# Patient Record
Sex: Female | Born: 1975 | Race: Black or African American | Hispanic: No | Smoking: Never smoker
Health system: Southern US, Community
[De-identification: ages and names within clinical notes are randomized; demographics above are authoritative.]

## PROBLEM LIST (undated history)

## (undated) DIAGNOSIS — N200 Calculus of kidney: Secondary | ICD-10-CM

## (undated) DIAGNOSIS — I10 Essential (primary) hypertension: Secondary | ICD-10-CM

## (undated) DIAGNOSIS — Z8711 Personal history of peptic ulcer disease: Secondary | ICD-10-CM

## (undated) DIAGNOSIS — F419 Anxiety disorder, unspecified: Secondary | ICD-10-CM

## (undated) DIAGNOSIS — R7303 Prediabetes: Secondary | ICD-10-CM

## (undated) DIAGNOSIS — Z8719 Personal history of other diseases of the digestive system: Secondary | ICD-10-CM

## (undated) DIAGNOSIS — G5600 Carpal tunnel syndrome, unspecified upper limb: Secondary | ICD-10-CM

## (undated) DIAGNOSIS — D649 Anemia, unspecified: Secondary | ICD-10-CM

## (undated) HISTORY — PX: CARPAL TUNNEL RELEASE: SHX101

## (undated) HISTORY — DX: Anxiety disorder, unspecified: F41.9

## (undated) HISTORY — PX: TUBAL LIGATION: SHX77

---

## 2000-10-10 ENCOUNTER — Other Ambulatory Visit: Admission: RE | Admit: 2000-10-10 | Discharge: 2000-10-10 | Payer: Self-pay | Admitting: Family Medicine

## 2000-12-24 ENCOUNTER — Encounter: Admission: RE | Admit: 2000-12-24 | Discharge: 2000-12-24 | Payer: Self-pay | Admitting: Family Medicine

## 2000-12-24 ENCOUNTER — Encounter: Payer: Self-pay | Admitting: Family Medicine

## 2002-03-05 ENCOUNTER — Other Ambulatory Visit: Admission: RE | Admit: 2002-03-05 | Discharge: 2002-03-05 | Payer: Self-pay | Admitting: Family Medicine

## 2003-03-30 ENCOUNTER — Other Ambulatory Visit: Admission: RE | Admit: 2003-03-30 | Discharge: 2003-03-30 | Payer: Self-pay | Admitting: Family Medicine

## 2003-07-05 ENCOUNTER — Encounter: Payer: Self-pay | Admitting: Internal Medicine

## 2003-07-05 ENCOUNTER — Ambulatory Visit (HOSPITAL_COMMUNITY): Admission: RE | Admit: 2003-07-05 | Discharge: 2003-07-05 | Payer: Self-pay | Admitting: Internal Medicine

## 2006-05-07 ENCOUNTER — Ambulatory Visit (HOSPITAL_COMMUNITY): Admission: RE | Admit: 2006-05-07 | Discharge: 2006-05-07 | Payer: Self-pay | Admitting: Family Medicine

## 2008-12-13 ENCOUNTER — Emergency Department (HOSPITAL_COMMUNITY): Admission: EM | Admit: 2008-12-13 | Discharge: 2008-12-14 | Payer: Self-pay | Admitting: Emergency Medicine

## 2010-12-21 ENCOUNTER — Emergency Department (HOSPITAL_COMMUNITY)
Admission: EM | Admit: 2010-12-21 | Discharge: 2010-12-21 | Disposition: A | Discharge status: Home / Self Care | Payer: PRIVATE HEALTH INSURANCE | Attending: Emergency Medicine | Admitting: Emergency Medicine

## 2010-12-21 DIAGNOSIS — I10 Essential (primary) hypertension: Secondary | ICD-10-CM | POA: Insufficient documentation

## 2010-12-21 DIAGNOSIS — M7989 Other specified soft tissue disorders: Secondary | ICD-10-CM | POA: Insufficient documentation

## 2010-12-21 DIAGNOSIS — R221 Localized swelling, mass and lump, neck: Secondary | ICD-10-CM | POA: Insufficient documentation

## 2010-12-21 DIAGNOSIS — M79609 Pain in unspecified limb: Secondary | ICD-10-CM | POA: Insufficient documentation

## 2010-12-21 DIAGNOSIS — M542 Cervicalgia: Secondary | ICD-10-CM | POA: Insufficient documentation

## 2010-12-21 DIAGNOSIS — R220 Localized swelling, mass and lump, head: Secondary | ICD-10-CM | POA: Insufficient documentation

## 2011-01-31 ENCOUNTER — Inpatient Hospital Stay (INDEPENDENT_AMBULATORY_CARE_PROVIDER_SITE_OTHER)
Admission: RE | Admit: 2011-01-31 | Discharge: 2011-01-31 | Disposition: A | Discharge status: Home / Self Care | Payer: PRIVATE HEALTH INSURANCE | Source: Ambulatory Visit | Attending: Family Medicine | Admitting: Family Medicine

## 2011-01-31 DIAGNOSIS — M779 Enthesopathy, unspecified: Secondary | ICD-10-CM

## 2011-05-08 ENCOUNTER — Emergency Department (HOSPITAL_COMMUNITY)
Admission: EM | Admit: 2011-05-08 | Discharge: 2011-05-08 | Disposition: A | Discharge status: Home / Self Care | Payer: PRIVATE HEALTH INSURANCE | Attending: Emergency Medicine | Admitting: Emergency Medicine

## 2011-05-08 ENCOUNTER — Emergency Department (HOSPITAL_COMMUNITY): Payer: PRIVATE HEALTH INSURANCE

## 2011-05-08 DIAGNOSIS — R109 Unspecified abdominal pain: Secondary | ICD-10-CM | POA: Insufficient documentation

## 2011-05-08 DIAGNOSIS — M5430 Sciatica, unspecified side: Secondary | ICD-10-CM | POA: Insufficient documentation

## 2011-05-08 DIAGNOSIS — X58XXXA Exposure to other specified factors, initial encounter: Secondary | ICD-10-CM | POA: Insufficient documentation

## 2011-05-08 DIAGNOSIS — S335XXA Sprain of ligaments of lumbar spine, initial encounter: Secondary | ICD-10-CM | POA: Insufficient documentation

## 2011-05-08 DIAGNOSIS — M545 Low back pain, unspecified: Secondary | ICD-10-CM | POA: Insufficient documentation

## 2011-05-08 DIAGNOSIS — N200 Calculus of kidney: Secondary | ICD-10-CM | POA: Insufficient documentation

## 2011-05-08 DIAGNOSIS — M79609 Pain in unspecified limb: Secondary | ICD-10-CM | POA: Insufficient documentation

## 2011-05-08 DIAGNOSIS — I10 Essential (primary) hypertension: Secondary | ICD-10-CM | POA: Insufficient documentation

## 2011-05-08 DIAGNOSIS — E669 Obesity, unspecified: Secondary | ICD-10-CM | POA: Insufficient documentation

## 2011-05-08 LAB — URINALYSIS, ROUTINE W REFLEX MICROSCOPIC
Ketones, ur: NEGATIVE mg/dL
Leukocytes, UA: NEGATIVE
Nitrite: NEGATIVE
Protein, ur: NEGATIVE mg/dL
Urobilinogen, UA: 0.2 mg/dL (ref 0.0–1.0)

## 2011-05-08 LAB — URINE MICROSCOPIC-ADD ON

## 2011-05-08 MED ORDER — IOHEXOL 300 MG/ML  SOLN
100.0000 mL | Freq: Once | INTRAMUSCULAR | Status: AC | PRN
  Administered 2011-05-08: 100 mL via INTRAVENOUS

## 2011-07-12 ENCOUNTER — Emergency Department (HOSPITAL_COMMUNITY)
Admission: EM | Admit: 2011-07-12 | Discharge: 2011-07-12 | Disposition: A | Discharge status: Home / Self Care | Payer: PRIVATE HEALTH INSURANCE | Attending: Emergency Medicine | Admitting: Emergency Medicine

## 2011-07-12 DIAGNOSIS — I10 Essential (primary) hypertension: Secondary | ICD-10-CM | POA: Insufficient documentation

## 2011-07-12 DIAGNOSIS — R220 Localized swelling, mass and lump, head: Secondary | ICD-10-CM | POA: Insufficient documentation

## 2011-07-12 DIAGNOSIS — K029 Dental caries, unspecified: Secondary | ICD-10-CM | POA: Insufficient documentation

## 2011-07-12 DIAGNOSIS — K089 Disorder of teeth and supporting structures, unspecified: Secondary | ICD-10-CM | POA: Insufficient documentation

## 2011-07-12 DIAGNOSIS — Z79899 Other long term (current) drug therapy: Secondary | ICD-10-CM | POA: Insufficient documentation

## 2011-07-12 DIAGNOSIS — R221 Localized swelling, mass and lump, neck: Secondary | ICD-10-CM | POA: Insufficient documentation

## 2011-09-29 ENCOUNTER — Emergency Department (HOSPITAL_COMMUNITY)
Admission: EM | Admit: 2011-09-29 | Discharge: 2011-09-30 | Disposition: A | Discharge status: Home / Self Care | Payer: PRIVATE HEALTH INSURANCE | Attending: Emergency Medicine | Admitting: Emergency Medicine

## 2011-09-29 ENCOUNTER — Encounter (HOSPITAL_COMMUNITY): Payer: Self-pay | Admitting: *Deleted

## 2011-09-29 DIAGNOSIS — M79609 Pain in unspecified limb: Secondary | ICD-10-CM | POA: Insufficient documentation

## 2011-09-29 DIAGNOSIS — M79606 Pain in leg, unspecified: Secondary | ICD-10-CM

## 2011-09-29 DIAGNOSIS — Z79899 Other long term (current) drug therapy: Secondary | ICD-10-CM | POA: Insufficient documentation

## 2011-09-29 DIAGNOSIS — M25579 Pain in unspecified ankle and joints of unspecified foot: Secondary | ICD-10-CM | POA: Insufficient documentation

## 2011-09-29 HISTORY — DX: Essential (primary) hypertension: I10

## 2011-09-29 NOTE — ED Notes (Signed)
Pt c/o lt lower leg for 2 weeks intermittently .  No known injury.  She has a cramping sensation and now has some numbness in her lt foot

## 2011-09-29 NOTE — ED Notes (Signed)
Pt to tx area via ambulatory with c/o left leg pain x one to two months. Smiles and converses with ease. Aware of plan of care. S/O at bedside. Pt noted in no acute distress.

## 2011-09-30 ENCOUNTER — Ambulatory Visit (HOSPITAL_COMMUNITY)
Admission: EM | Admit: 2011-09-30 | Payer: PRIVATE HEALTH INSURANCE | Source: Ambulatory Visit | Admitting: Emergency Medicine

## 2011-09-30 DIAGNOSIS — M79609 Pain in unspecified limb: Secondary | ICD-10-CM

## 2011-09-30 MED ORDER — HYDROCODONE-ACETAMINOPHEN 5-500 MG PO TABS: 1.0000 | ORAL_TABLET | Freq: Four times a day (QID) | ORAL | Status: AC | PRN

## 2011-09-30 MED ORDER — PREDNISONE 10 MG PO TABS: 20.0000 mg | ORAL_TABLET | Freq: Two times a day (BID) | ORAL | Status: DC

## 2011-09-30 NOTE — ED Provider Notes (Signed)
History     CSN: [id number]  Arrival date & time 09/29/11  2229   First MD Initiated Contact with Patient 09/30/11 0005      Chief Complaint  Patient presents with  . Leg Pain    (Consider location/radiation/quality/duration/timing/severity/associated sxs/prior treatment) HPI Comments: Presents complaining of a two month history of left leg pain.  She works as a Lawyer at [employer] but does not recall any specific injury.  Worse with ambulation.  Pain is in the calf and into ankle and extends into the back of the thigh.  She denies back pain or bowel or bladder complaints.  No fevers or chills.  No chest pain or shortness of breath.  Patient is a 36 y.o. female presenting with leg pain. The history is provided by the patient.  Leg Pain  The incident occurred at work. There was no injury mechanism. The pain is present in the left leg. The pain is moderate. Pertinent negatives include no numbness, no loss of motion and no muscle weakness.    Past Medical History  Diagnosis Date  . Hypertension     History reviewed. No pertinent past surgical history.  History reviewed. No pertinent family history.  History  Substance Use Topics  . Smoking status: Never Smoker   . Smokeless tobacco: Not on file  . Alcohol Use: Yes    OB History    Grav Para Term Preterm Abortions TAB SAB Ect Mult Living                  Review of Systems  Neurological: Negative for numbness.  All other systems reviewed and are negative.    Allergies  Aspirin and Ibuprofen  Home Medications   Current Outpatient Rx  Name Route Sig Dispense Refill  . ACETAMINOPHEN 500 MG PO TABS Oral Take 1,000 mg by mouth every 4 (four) hours as needed. For pain.    Marland Kitchen CLONAZEPAM 0.5 MG PO TABS Oral Take 0.25 mg by mouth 2 (two) times daily as needed. For anxiety    . HYDROCHLOROTHIAZIDE 12.5 MG PO CAPS Oral Take 12.5 mg by mouth daily.    Marland Kitchen OVER THE COUNTER MEDICATION Oral Take 2 capsules by mouth every 4  (four) hours as needed. OTC Leg Cramps. Uses when she feels onset of leg cramps coming on.      BP 156/89  Temp(Src) 98.3 F (36.8 C) (Oral)  Resp 20  SpO2 100%  LMP 09/07/2011  Physical Exam  Nursing note and vitals reviewed. Constitutional: She appears well-developed and well-nourished.  HENT:  Head: Normocephalic.  Neck: Normal range of motion. Neck supple.  Cardiovascular: Normal rate and regular rhythm.   No murmur heard. Pulmonary/Chest: Effort normal and breath sounds normal. No respiratory distress.  Musculoskeletal: Normal range of motion. She exhibits no edema.       The left leg appears grossly normal and is symmetrical with the right.  There is no edema.  There is some calf tenderness but no palpable cord and absent Homan sign. Pulses are easily palpable distally.  Neurological: She is alert.  Skin: Skin is warm and dry.    ED Course  Procedures (including critical care time)  Labs Reviewed - No data to display No results found.   No diagnosis found.    MDM  Suspect sciatica but would like to rule out dvt with vascular study as outpt.  Will prescribe prednisone, lortab and arrange vascular study.  Geoffery Lyons, MD 09/30/11 [phone]

## 2011-09-30 NOTE — ED Notes (Signed)
Ambulatory to check out desk stable and in no acute distress at time of discharge.  

## 2012-01-05 ENCOUNTER — Emergency Department (HOSPITAL_COMMUNITY)
Admission: EM | Admit: 2012-01-05 | Discharge: 2012-01-05 | Disposition: A | Discharge status: Home / Self Care | Payer: PRIVATE HEALTH INSURANCE | Attending: Emergency Medicine | Admitting: Emergency Medicine

## 2012-01-05 ENCOUNTER — Encounter (HOSPITAL_COMMUNITY): Payer: Self-pay | Admitting: Emergency Medicine

## 2012-01-05 DIAGNOSIS — I10 Essential (primary) hypertension: Secondary | ICD-10-CM | POA: Insufficient documentation

## 2012-01-05 DIAGNOSIS — R05 Cough: Secondary | ICD-10-CM | POA: Insufficient documentation

## 2012-01-05 DIAGNOSIS — J069 Acute upper respiratory infection, unspecified: Secondary | ICD-10-CM

## 2012-01-05 DIAGNOSIS — R059 Cough, unspecified: Secondary | ICD-10-CM | POA: Insufficient documentation

## 2012-01-05 DIAGNOSIS — J040 Acute laryngitis: Secondary | ICD-10-CM

## 2012-01-05 DIAGNOSIS — H9209 Otalgia, unspecified ear: Secondary | ICD-10-CM | POA: Insufficient documentation

## 2012-01-05 DIAGNOSIS — R509 Fever, unspecified: Secondary | ICD-10-CM | POA: Insufficient documentation

## 2012-01-05 DIAGNOSIS — Z79899 Other long term (current) drug therapy: Secondary | ICD-10-CM | POA: Insufficient documentation

## 2012-01-05 HISTORY — DX: Carpal tunnel syndrome, unspecified upper limb: G56.00

## 2012-01-05 NOTE — ED Notes (Signed)
Pt c/o sore throat, ear pain, and cough since Wednesday. Pt has taken over the counter medications without relief. Pt talks with hoarse voice.

## 2012-01-05 NOTE — ED Provider Notes (Signed)
History     CSN: [id number]  Arrival date & time 01/05/12  4098   First MD Initiated Contact with Patient 01/05/12 0915      Chief Complaint  Patient presents with  . Sore Throat  . Otalgia  . Cough    (Consider location/radiation/quality/duration/timing/severity/associated sxs/prior treatment) HPI  35yoF pw multple complaints. C/O 3 days of NP cough, b/l ear pain, sore throat. Low grade fever to 100.1 at home. Has been taking tylenol with min relief. Denies sick contacts. Denies rash. No chest pain or shortness of breath. C/O hoarse voice as well. No noisy breathing. No sensation of throat fullness/tightening.  ED Notes, ED Provider Notes from 01/05/12 0000 to 01/05/12 09:02:13       Keshia Tommy Rainwater, RN 01/05/2012 09:00      Pt c/o sore throat, ear pain, and cough since Wednesday. Pt has taken over the counter medications without relief. Pt talks with hoarse voice.     Past Medical History  Diagnosis Date  . Hypertension   . Carpal tunnel syndrome     Past Surgical History  Procedure Date  . Tubal ligation   . Carpal tunnel release     History reviewed. No pertinent family history.  History  Substance Use Topics  . Smoking status: Never Smoker   . Smokeless tobacco: Not on file  . Alcohol Use: No    OB History    Grav Para Term Preterm Abortions TAB SAB Ect Mult Living                  Review of Systems  All other systems reviewed and are negative.   except as noted HPI   Allergies  Aspirin and Ibuprofen  Home Medications   Current Outpatient Rx  Name Route Sig Dispense Refill  . ACETAMINOPHEN 500 MG PO TABS Oral Take 1,000 mg by mouth every 4 (four) hours as needed. For pain.    Marland Kitchen CLONAZEPAM 0.5 MG PO TABS Oral Take 0.25 mg by mouth 2 (two) times daily as needed. For anxiety    . HYDROCHLOROTHIAZIDE 12.5 MG PO CAPS Oral Take 12.5 mg by mouth daily.    Marland Kitchen OVER THE COUNTER MEDICATION Oral Take 2 capsules by mouth every 4 (four) hours as needed.  OTC Leg Cramps. Uses when she feels onset of leg cramps coming on.    Marland Kitchen PHENOL 1.4 % MT LIQD Mouth/Throat Use as directed 1 spray in the mouth or throat daily as needed. For sore throat.    Marland Kitchen PSEUDOEPH-CPM-DM-APAP 30-2-15-325 MG PO TABS Oral Take 30 mLs by mouth once.    . DAYQUIL PO Oral Take 2 tablets by mouth once.      BP 132/89  Pulse 88  Temp(Src) 98.8 F (37.1 C) (Oral)  Resp 20  SpO2 100%  LMP 12/12/2011  Physical Exam  Nursing note and vitals reviewed. Constitutional: She is oriented to person, place, and time. She appears well-developed.  HENT:  Head: Atraumatic.  Mouth/Throat: Oropharynx is clear and moist.  Eyes: Conjunctivae and EOM are normal. Pupils are equal, round, and reactive to light.  Neck: Normal range of motion. Neck supple.  Cardiovascular: Normal rate, regular rhythm, normal heart sounds and intact distal pulses.   Pulmonary/Chest: Effort normal and breath sounds normal. No respiratory distress. She has no wheezes. She has no rales.  Abdominal: Soft. She exhibits no distension. There is no tenderness. There is no rebound and no guarding.  Musculoskeletal: Normal range of motion.  Neurological: She  is alert and oriented to person, place, and time.  Skin: Skin is warm and dry. No rash noted.  Psychiatric: She has a normal mood and affect.    ED Course  Procedures (including critical care time)   Labs Reviewed  RAPID STREP SCREEN  STREP A DNA PROBE   No results found.   1. Upper respiratory infection   2. Laryngitis     MDM  Likely URI/pharyngitis/laryngitis. Supportive care, pmd f/u. Precautions for return.        Forbes Cellar, MD 01/05/12 1037

## 2012-01-05 NOTE — Discharge Instructions (Signed)
Antibiotic Nonuse  Your caregiver felt that the infection or problem was not one that would be helped with an antibiotic. Infections may be caused by viruses or bacteria. Only a caregiver can tell which one of these is the likely cause of an illness. A cold is the most common cause of infection in both adults and children. A cold is a virus. Antibiotic treatment will have no effect on a viral infection. Viruses can lead to many lost days of work caring for sick children and many missed days of school. Children may catch as many as 10 "colds" or "flus" per year during which they can be tearful, cranky, and uncomfortable. The goal of treating a virus is aimed at keeping the ill person comfortable. Antibiotics are medications used to help the body fight bacterial infections. There are relatively few types of bacteria that cause infections but there are hundreds of viruses. While both viruses and bacteria cause infection they are very different types of germs. A viral infection will typically go away by itself within 7 to 10 days. Bacterial infections may spread or get worse without antibiotic treatment. Examples of bacterial infections are:  Sore throats (like strep throat or tonsillitis).   Infection in the lung (pneumonia).   Ear and skin infections.  Examples of viral infections are:  Colds or flus.   Most coughs and bronchitis.   Sore throats not caused by Strep.   Runny noses.  It is often best not to take an antibiotic when a viral infection is the cause of the problem. Antibiotics can kill off the helpful bacteria that we have inside our body and allow harmful bacteria to start growing. Antibiotics can cause side effects such as allergies, nausea, and diarrhea without helping to improve the symptoms of the viral infection. Additionally, repeated uses of antibiotics can cause bacteria inside of our body to become resistant. That resistance can be passed onto harmful bacterial. The next time  you have an infection it may be harder to treat if antibiotics are used when they are not needed. Not treating with antibiotics allows our own immune system to develop and take care of infections more efficiently. Also, antibiotics will work better for [place] when they are prescribed for bacterial infections. Treatments for a child that is ill may include:  Give extra fluids throughout the day to stay hydrated.   Get plenty of rest.   Only give your child over-the-counter or prescription medicines for pain, discomfort, or fever as directed by your caregiver.   The use of a cool mist humidifier may help stuffy noses.   Cold medications if suggested by your caregiver.  Your caregiver may decide to start you on an antibiotic if:  The problem you were seen for today continues for a longer length of time than expected.   You develop a secondary bacterial infection.  SEEK MEDICAL CARE IF:  Fever lasts longer than 5 days.   Symptoms continue to get worse after 5 to 7 days or become severe.   Difficulty in breathing develops.   Signs of dehydration develop (poor drinking, rare urinating, dark colored urine).   Changes in behavior or worsening tiredness (listlessness or lethargy).  Document Released: 11/12/2001 Document Revised: 08/23/2011 Document Reviewed: 05/11/2009 [facility] Patient Information 2012 [place], [place].  Laryngitis At the top of your windpipe is your voice box. It is the source of your voice. Inside your voice box are 2 bands of muscles called vocal cords. When you breathe, your vocal cords  are relaxed and open so that air can get into the lungs. When you decide to say something, these cords come together and vibrate. The sound from these vibrations goes into your throat and comes out through your mouth as sound. Laryngitis is an inflammation of the vocal cords that causes hoarseness, cough, loss of voice, sore throat, and dry throat. Laryngitis can be temporary (acute) or  long-term (chronic). Most cases of acute laryngitis improve with time.Chronic laryngitis lasts for more than 3 weeks. CAUSES Laryngitis can often be related to excessive smoking, talking, or yelling, as well as inhalation of toxic fumes and allergies. Acute laryngitis is usually caused by a viral infection, vocal strain, measles or mumps, or bacterial infections. Chronic laryngitis is usually caused by vocal cord strain, vocal cord injury, postnasal drip, growths on the vocal cords, or acid reflux. SYMPTOMS   Cough.   Sore throat.   Dry throat.  RISK FACTORS  Respiratory infections.   Exposure to irritating substances, such as cigarette smoke, excessive amounts of alcohol, stomach acids, and workplace chemicals.   Voice trauma, such as vocal cord injury from shouting or speaking too loud.  DIAGNOSIS  Your cargiver will perform a physical exam. During the physical exam, your caregiver will examine your throat. The most common sign of laryngitis is hoarseness.  HOME CARE INSTRUCTIONS  Drink enough fluids to keep your urine clear or pale yellow.   Rest until you no longer have symptoms or as directed by your caregiver.   Breathe in moist air.   Take all medicine as directed by your caregiver.   Do not smoke.   Talk as little as possible (this includes whispering).   Write on paper instead of talking until your voice is back to normal.   Follow up with your caregiver if your condition has not improved after 10 days.  SEEK MEDICAL CARE IF:   You have trouble breathing.   You cough up blood.   You have persistent fever.   You have increasing pain.   You have difficulty swallowing.  MAKE SURE YOU:  Understand these instructions.   Will watch your condition.   Will get help right away if you are not doing well or get worse.  Document Released: 09/03/2005 Document Revised: 08/23/2011 Document Reviewed: 11/09/2010 [facility] Patient Information 2012 [place],  [place].  RESOURCE GUIDE  Dental Problems  Patients with Medicaid: [facility] [phone] [address]                                           [phone] [address] Phone:  [phone]                                                   Phone:  [phone]  If unable to pay or uninsured, contact:  [facility] or [facility]. to become qualified for the adult dental clinic.  Chronic Pain Problems Contact [facility]  [phone] Patients need to be referred by their primary care doctor.  Insufficient Money for Medicine Contact [employer]:  call "211" or [facility] [phone].  No Primary Care Doctor Call [facility]  [phone] Other agencies that provide inexpensive medical care    [facility]  [phone]    [facility]  [phone]    [facility]  [phone]    [facility]  [phone]    [facility]  [phone]    [facility]  [phone]  Psychological Services [facility]  [phone] [facility]  [phone] [facility]   [phone] (emergency services [phone])  Abuse/Neglect [facility] Child Abuse Hotline 726-760-3786 [facility] Child Abuse Hotline [phone] (After Hours)  Emergency Shelter [facility] [phone]  Maternity Homes [facility] at [facility] [phone] [facility] [phone]  MRSA Hotline #:   332 173 7569    [facility] Resources  [facility]  [employer]                           [facility] [address] [place]                     [address]         [address] [place] [address]  [place]ell Reveal Phone:  [phone]                                  Phone:  [phone]                   Phone:   [phone]  [facility] Phone:  [phone]  [facility] Child Abuse Hotline [phone] [phone] (After Hours)

## 2012-06-12 ENCOUNTER — Emergency Department (HOSPITAL_COMMUNITY): Payer: PRIVATE HEALTH INSURANCE

## 2012-06-12 ENCOUNTER — Encounter (HOSPITAL_COMMUNITY): Payer: Self-pay | Admitting: Emergency Medicine

## 2012-06-12 ENCOUNTER — Emergency Department (HOSPITAL_COMMUNITY)
Admission: EM | Admit: 2012-06-12 | Discharge: 2012-06-12 | Disposition: A | Discharge status: Home / Self Care | Payer: PRIVATE HEALTH INSURANCE | Attending: Emergency Medicine | Admitting: Emergency Medicine

## 2012-06-12 DIAGNOSIS — Z87442 Personal history of urinary calculi: Secondary | ICD-10-CM | POA: Insufficient documentation

## 2012-06-12 DIAGNOSIS — R109 Unspecified abdominal pain: Secondary | ICD-10-CM

## 2012-06-12 DIAGNOSIS — Z886 Allergy status to analgesic agent status: Secondary | ICD-10-CM | POA: Insufficient documentation

## 2012-06-12 DIAGNOSIS — Z79899 Other long term (current) drug therapy: Secondary | ICD-10-CM | POA: Insufficient documentation

## 2012-06-12 DIAGNOSIS — I10 Essential (primary) hypertension: Secondary | ICD-10-CM | POA: Insufficient documentation

## 2012-06-12 LAB — COMPREHENSIVE METABOLIC PANEL
ALT: 8 U/L (ref 0–35)
AST: 16 U/L (ref 0–37)
Albumin: 3.2 g/dL — ABNORMAL LOW (ref 3.5–5.2)
Alkaline Phosphatase: 75 U/L (ref 39–117)
CO2: 27 mEq/L (ref 19–32)
Chloride: 104 mEq/L (ref 96–112)
GFR calc non Af Amer: >90 mL/min (ref >90)
Potassium: 4 mEq/L (ref 3.5–5.1)
Total Bilirubin: 0.1 mg/dL — ABNORMAL LOW (ref 0.3–1.2)

## 2012-06-12 LAB — POCT PREGNANCY, URINE: Preg Test, Ur: NEGATIVE

## 2012-06-12 LAB — CBC WITH DIFFERENTIAL/PLATELET
Basophils Absolute: 0 10*3/uL (ref 0.0–0.1)
Basophils Relative: 0 % (ref 0–1)
HCT: 30.8 % — ABNORMAL LOW (ref 36.0–46.0)
Hemoglobin: 9.6 g/dL — ABNORMAL LOW (ref 12.0–15.0)
Lymphocytes Relative: 31 % (ref 12–46)
MCHC: 31.2 g/dL (ref 30.0–36.0)
Monocytes Absolute: 0.6 10*3/uL (ref 0.1–1.0)
Neutro Abs: 4.4 10*3/uL (ref 1.7–7.7)
Neutrophils Relative %: 59 % (ref 43–77)
RDW: 18 % — ABNORMAL HIGH (ref 11.5–15.5)
WBC: 7.5 10*3/uL (ref 4.0–10.5)

## 2012-06-12 LAB — URINALYSIS, ROUTINE W REFLEX MICROSCOPIC
Glucose, UA: NEGATIVE mg/dL
Hgb urine dipstick: NEGATIVE
Leukocytes, UA: NEGATIVE
Protein, ur: NEGATIVE mg/dL
Specific Gravity, Urine: 1.01 (ref 1.005–1.030)
Urobilinogen, UA: 0.2 mg/dL (ref 0.0–1.0)

## 2012-06-12 MED ORDER — CYCLOBENZAPRINE HCL 10 MG PO TABS: 10.0000 mg | ORAL_TABLET | Freq: Two times a day (BID) | ORAL | Status: DC | PRN

## 2012-06-12 MED ORDER — ONDANSETRON HCL 4 MG/2ML IJ SOLN
4.0000 mg | Freq: Once | INTRAMUSCULAR | Status: AC
  Administered 2012-06-12: 4 mg via INTRAVENOUS
  Filled 2012-06-12: qty 2

## 2012-06-12 MED ORDER — HYDROCODONE-ACETAMINOPHEN 5-500 MG PO TABS: 1.0000 | ORAL_TABLET | Freq: Four times a day (QID) | ORAL | Status: DC | PRN

## 2012-06-12 MED ORDER — MORPHINE SULFATE 4 MG/ML IJ SOLN
4.0000 mg | Freq: Once | INTRAMUSCULAR | Status: AC
  Administered 2012-06-12: 4 mg via INTRAVENOUS
  Filled 2012-06-12: qty 1

## 2012-06-12 NOTE — ED Notes (Signed)
Pt c/o right sided flank pain into back x 2 weeks; pt sts some nausea; pt sts hx of kidney stone and sts feels same

## 2012-06-12 NOTE — ED Provider Notes (Signed)
History     CSN: [id number]  Arrival date & time 06/12/12  1206   First MD Initiated Contact with Patient 06/12/12 1328      Chief Complaint  Patient presents with  . Flank Pain    (Consider location/radiation/quality/duration/timing/severity/associated sxs/prior treatment) Patient is a 36 y.o. female presenting with flank pain.  Flank Pain This is a new problem. The current episode started 1 to 4 weeks ago. The problem has been gradually worsening. Associated symptoms include abdominal pain and nausea. Pertinent negatives include no anorexia, chills, fever, headaches, neck pain, urinary symptoms, vomiting or weakness. She has tried acetaminophen for the symptoms. The treatment provided no relief.  Pt reports right flank pain for about 2 wks. States radiates into lower abdomen. Hx of kidney stones. States pain comes and goes. Denies any urinary symptoms, no fever, no blood in her urine. Admits to nausea. Denies vomiting. Symptoms worsened with laying back flat and eating.   Past Medical History  Diagnosis Date  . Hypertension   . Carpal tunnel syndrome     Past Surgical History  Procedure Date  . Tubal ligation   . Carpal tunnel release     History reviewed. No pertinent family history.  History  Substance Use Topics  . Smoking status: Never Smoker   . Smokeless tobacco: Not on file  . Alcohol Use: No    OB History    Grav Para Term Preterm Abortions TAB SAB Ect Mult Living                  Review of Systems  Constitutional: Negative for fever and chills.  HENT: Negative for neck pain and neck stiffness.   Respiratory: Negative.   Cardiovascular: Negative.   Gastrointestinal: Positive for nausea and abdominal pain. Negative for vomiting, diarrhea, constipation and anorexia.  Genitourinary: Positive for flank pain. Negative for dysuria, hematuria, decreased urine volume, vaginal bleeding, vaginal discharge, vaginal pain and menstrual problem.  Musculoskeletal:  Negative for back pain.  Skin: Negative.   Neurological: Negative for dizziness, weakness and headaches.  Psychiatric/Behavioral: Negative.     Allergies  Aspirin and Ibuprofen  Home Medications   Current Outpatient Rx  Name Route Sig Dispense Refill  . ACETAMINOPHEN 500 MG PO TABS Oral Take 1,000 mg by mouth every 4 (four) hours as needed. For pain.    Marland Kitchen CLONAZEPAM 0.5 MG PO TABS Oral Take 0.25 mg by mouth 2 (two) times daily as needed. For anxiety    . HYDROCHLOROTHIAZIDE 12.5 MG PO CAPS Oral Take 12.5 mg by mouth daily.    Marland Kitchen OVER THE COUNTER MEDICATION Oral Take 2 capsules by mouth every 4 (four) hours as needed. OTC Leg Cramps. Uses when she feels onset of leg cramps coming on.    Marland Kitchen PHENOL 1.4 % MT LIQD Mouth/Throat Use as directed 1 spray in the mouth or throat daily as needed. For sore throat.    Marland Kitchen PSEUDOEPH-CPM-DM-APAP 30-2-15-325 MG PO TABS Oral Take 30 mLs by mouth once.    . DAYQUIL PO Oral Take 2 tablets by mouth once.      BP 153/83  Pulse 94  Temp 98.6 F (37 C) (Oral)  Resp 16  SpO2 100%  Physical Exam  Nursing note and vitals reviewed. Constitutional: She is oriented to person, place, and time. She appears well-developed and well-nourished. No distress.  Eyes: Conjunctivae normal are normal.  Neck: Neck supple.  Cardiovascular: Normal rate, regular rhythm and normal heart sounds.   Pulmonary/Chest: Effort normal  and breath sounds normal. No respiratory distress. She has no wheezes. She has no rales.  Abdominal: Soft. Bowel sounds are normal. There is no rebound and no guarding.       RUQ tenderness. No CVA tenderness bilaterally  Neurological: She is alert and oriented to person, place, and time.  Skin: Skin is warm and dry.  Psychiatric: She has a normal mood and affect. Her behavior is normal.    ED Course  Procedures (including critical care time)  PT with right flank and right upper abdominal pain. UA is back and is clear with no RBCs and no signs of  infection. Will get [place] to look for cholecystitis or possible hydronephrosis. Pain meds and labs pending.   Results for orders placed during the hospital encounter of 06/12/12  URINALYSIS, ROUTINE W REFLEX MICROSCOPIC      Component Value Range   Color, Urine YELLOW  YELLOW   APPearance CLEAR  CLEAR   Specific Gravity, Urine 1.010  1.005 - 1.030   pH 6.5  5.0 - 8.0   Glucose, UA NEGATIVE  NEGATIVE mg/dL   Hgb urine dipstick NEGATIVE  NEGATIVE   Bilirubin Urine NEGATIVE  NEGATIVE   Ketones, ur NEGATIVE  NEGATIVE mg/dL   Protein, ur NEGATIVE  NEGATIVE mg/dL   Urobilinogen, UA 0.2  0.0 - 1.0 mg/dL   Nitrite NEGATIVE  NEGATIVE   Leukocytes, UA NEGATIVE  NEGATIVE  POCT PREGNANCY, URINE      Component Value Range   Preg Test, Ur NEGATIVE  NEGATIVE  CBC WITH DIFFERENTIAL      Component Value Range   WBC 7.5  4.0 - 10.5 K/uL   RBC 4.30  3.87 - 5.11 MIL/uL   Hemoglobin 9.6 (*) 12.0 - 15.0 g/dL   HCT 45.4 (*) 09.8 - 11.9 %   MCV 71.6 (*) 78.0 - 100.0 fL   MCH 22.3 (*) 26.0 - 34.0 pg   MCHC 31.2  30.0 - 36.0 g/dL   RDW 14.7 (*) 82.9 - 56.2 %   Platelets 365  150 - 400 K/uL   Neutrophils Relative 59  43 - 77 %   Neutro Abs 4.4  1.7 - 7.7 K/uL   Lymphocytes Relative 31  12 - 46 %   Lymphs Abs 2.3  0.7 - 4.0 K/uL   Monocytes Relative 8  3 - 12 %   Monocytes Absolute 0.6  0.1 - 1.0 K/uL   Eosinophils Relative 2  0 - 5 %   Eosinophils Absolute 0.2  0.0 - 0.7 K/uL   Basophils Relative 0  0 - 1 %   Basophils Absolute 0.0  0.0 - 0.1 K/uL  COMPREHENSIVE METABOLIC PANEL      Component Value Range   Sodium 138  135 - 145 mEq/L   Potassium 4.0  3.5 - 5.1 mEq/L   Chloride 104  96 - 112 mEq/L   CO2 27  19 - 32 mEq/L   Glucose, Bld 87  70 - 99 mg/dL   BUN 8  6 - 23 mg/dL   Creatinine, Ser 1.30  0.50 - 1.10 mg/dL   Calcium 9.3  8.4 - 86.5 mg/dL   Total Protein 7.4  6.0 - 8.3 g/dL   Albumin 3.2 (*) 3.5 - 5.2 g/dL   AST 16  0 - 37 U/L   ALT 8  0 - 35 U/L   Alkaline Phosphatase 75  39 - 117  U/L   Total Bilirubin 0.1 (*) 0.3 - 1.2 mg/dL  GFR calc non Af Amer >90  >90 mL/min   GFR calc Af Amer >90  >90 mL/min  LIPASE, BLOOD      Component Value Range   Lipase 14  11 - 59 U/L   US Abdomen Complete  06/12/2012  *RADIOLOGY REPORT*  Clinical Data:  Right-sided flank pain.  COMPLETE ABDOMINAL ULTRASOUND  Comparison:  Ultrasound 03/24/2009  Findings:  Gallbladder:  No shadowing gallstones or echogenic sludge.  No gallbladder wall thickening or pericholecystic fluid.  Negative sonographic Murphy's sign according to the ultrasound technologist.  Common bile duct:  Normal caliber measuring 4 mm within the porta hepatis.  Liver:  Normal size and echotexture without focal parenchymal abnormality.  Patent portal vein with hepatopetal flow.  IVC:  Patent throughout its visualized course in the abdomen.  Pancreas:  Although the pancreas is difficult to visualize in its entirety, no focal pancreatic abnormality is identified.  Spleen:  Normal size and echotexture without focal parenchymal abnormality.6.5 cm in length.  Right Kidney:  No hydronephrosis.  Well-preserved cortex.  Normal size and parenchymal echotexture without focal abnormalities. 10.1 cm in length.  Left Kidney:  No hydronephrosis.  Well-preserved cortex.  Normal size and parenchymal echotexture without focal abnormalities. 9.9 cm in length.  Abdominal aorta:  Abdominal aorta measures up to 2.0 cm in diameter and tapers appropriately distally.  IMPRESSION: 1.  No acute findings in the abdomen to account for the patient's symptoms.   Original Report Authenticated By: Florencia Reasons, M.D.     3:43 PM Normal gallbladder on [place], no hydronephrosis, doubt a large kidney stone. UA negative. Pt feeling better. Abdomen soft, no surgical abdomen. Suspect possible musculoskeletal pain. Will treat with muscle relaxants and follow up   1. Right flank pain       MDM          Lottie Mussel, PA 06/12/12 1643

## 2012-06-13 NOTE — ED Provider Notes (Signed)
Medical screening examination/treatment/procedure(s) were performed by non-physician practitioner and as supervising physician I was immediately available for consultation/collaboration.  Geoffery Lyons, MD 06/13/12 [phone]

## 2012-08-05 ENCOUNTER — Encounter (HOSPITAL_COMMUNITY): Payer: Self-pay | Admitting: *Deleted

## 2012-08-05 ENCOUNTER — Emergency Department (HOSPITAL_COMMUNITY): Payer: PRIVATE HEALTH INSURANCE

## 2012-08-05 ENCOUNTER — Emergency Department (HOSPITAL_COMMUNITY)
Admission: EM | Admit: 2012-08-05 | Discharge: 2012-08-05 | Disposition: A | Discharge status: Home / Self Care | Payer: PRIVATE HEALTH INSURANCE | Attending: Emergency Medicine | Admitting: Emergency Medicine

## 2012-08-05 DIAGNOSIS — R079 Chest pain, unspecified: Secondary | ICD-10-CM | POA: Insufficient documentation

## 2012-08-05 DIAGNOSIS — G5600 Carpal tunnel syndrome, unspecified upper limb: Secondary | ICD-10-CM | POA: Insufficient documentation

## 2012-08-05 DIAGNOSIS — I10 Essential (primary) hypertension: Secondary | ICD-10-CM | POA: Insufficient documentation

## 2012-08-05 DIAGNOSIS — R1013 Epigastric pain: Secondary | ICD-10-CM | POA: Insufficient documentation

## 2012-08-05 DIAGNOSIS — R109 Unspecified abdominal pain: Secondary | ICD-10-CM

## 2012-08-05 LAB — CBC
MCV: 70.4 fL — ABNORMAL LOW (ref 78.0–100.0)
Platelets: 406 10*3/uL — ABNORMAL HIGH (ref 150–400)
RBC: 4.26 MIL/uL (ref 3.87–5.11)
WBC: 6.9 10*3/uL (ref 4.0–10.5)

## 2012-08-05 LAB — COMPREHENSIVE METABOLIC PANEL
ALT: 9 U/L (ref 0–35)
AST: 17 U/L (ref 0–37)
Alkaline Phosphatase: 76 U/L (ref 39–117)
CO2: 27 mEq/L (ref 19–32)
Chloride: 103 mEq/L (ref 96–112)
Creatinine, Ser: 0.8 mg/dL (ref 0.50–1.10)
GFR calc non Af Amer: >90 mL/min (ref >90)
Potassium: 4 mEq/L (ref 3.5–5.1)
Total Bilirubin: 0.1 mg/dL — ABNORMAL LOW (ref 0.3–1.2)

## 2012-08-05 LAB — HCG, SERUM, QUALITATIVE: Preg, Serum: NEGATIVE

## 2012-08-05 MED ORDER — ONDANSETRON HCL 4 MG/2ML IJ SOLN
4.0000 mg | Freq: Once | INTRAMUSCULAR | Status: AC
  Administered 2012-08-05: 4 mg via INTRAVENOUS
  Filled 2012-08-05: qty 2

## 2012-08-05 MED ORDER — MORPHINE SULFATE 4 MG/ML IJ SOLN
4.0000 mg | Freq: Once | INTRAMUSCULAR | Status: AC
  Administered 2012-08-05: 4 mg via INTRAVENOUS
  Filled 2012-08-05: qty 1

## 2012-08-05 MED ORDER — OMEPRAZOLE 20 MG PO CPDR: 20.0000 mg | DELAYED_RELEASE_CAPSULE | Freq: Every day | ORAL | Status: DC

## 2012-08-05 MED ORDER — ONDANSETRON 8 MG PO TBDP: 8.0000 mg | ORAL_TABLET | Freq: Three times a day (TID) | ORAL | Status: DC | PRN

## 2012-08-05 MED ORDER — HYDROCODONE-ACETAMINOPHEN 5-325 MG PO TABS: 1.0000 | ORAL_TABLET | ORAL | Status: DC | PRN

## 2012-08-05 NOTE — ED Notes (Signed)
Patient reports onset of right sided pain/chest pain.  She states the pain is intermittent and sharp in nature.  She has had nausea.  She reports normal bm.  Patient also reports bloating.  She has increased pain after eating

## 2012-08-07 NOTE — ED Provider Notes (Addendum)
[facility]  Department of Emergency Medicine   Chief Complaint: Chest pain   History of present illness: pt presents with several days of epigastric abdominal pain with some radiation in her chest. No SOB. No fevers or chills or unilateral leg swelling. Nausea and vomiting. No diarrhea. Denies urinary symptoms. No back pain. Symptoms are mild/moderate in severity  ROS: all systems reviewed and negative accept as in HPI  Past Medical History  Diagnosis Date  . Hypertension   . Carpal tunnel syndrome    Past Surgical History  Procedure Date  . Tubal ligation   . Carpal tunnel release    History   Social History  . Marital Status: Married    Spouse Name: N/A    Number of Children: N/A  . Years of Education: N/A   Occupational History  . Not on file.   Social History Main Topics  . Smoking status: Never Smoker   . Smokeless tobacco: Not on file  . Alcohol Use: No  . Drug Use: No  . Sexually Active:    Other Topics Concern  . Not on file   Social History Narrative  . No narrative on file   Allergies  Allergen Reactions  . Aspirin     Patient can't take due to stomach ulcers.  . Ibuprofen     Patient can't take due to stomach ulcers   No current facility-administered medications for this encounter. Current outpatient prescriptions:acetaminophen (TYLENOL) 500 MG tablet, Take 1,000 mg by mouth every 4 (four) hours as needed. For pain., Disp: , Rfl: ;  cyclobenzaprine (FLEXERIL) 10 MG tablet, Take 1 tablet (10 mg total) by mouth 2 (two) times daily as needed for muscle spasms., Disp: 20 tablet, Rfl: 0;  traMADol (ULTRAM) 50 MG tablet, Take 50 mg by mouth every 6 (six) hours as needed. For pain, Disp: , Rfl:  HYDROcodone-acetaminophen (NORCO/VICODIN) 5-325 MG per tablet, Take 1 tablet by mouth every 4 (four) hours as needed for pain., Disp: 15 tablet, Rfl: 0;  HYDROcodone-acetaminophen (VICODIN) 5-500 MG per tablet, Take 1-2 tablets by mouth every 6 (six)  hours as needed for pain., Disp: 15 tablet, Rfl: 0;  omeprazole (PRILOSEC) 20 MG capsule, Take 1 capsule (20 mg total) by mouth daily., Disp: 30 capsule, Rfl: 0 ondansetron (ZOFRAN ODT) 8 MG disintegrating tablet, Take 1 tablet (8 mg total) by mouth every 8 (eight) hours as needed for nausea., Disp: 12 tablet, Rfl: 0   Physical Exam  Nursing note and vitals reviewed. Constitutional: She is oriented to person, place, and time. She appears well-developed and well-nourished. No distress.  HENT:  Head: Normocephalic and atraumatic.  Eyes: EOM are normal.  Neck: Normal range of motion.  Cardiovascular: Normal rate, regular rhythm and normal heart sounds.   Pulmonary/Chest: Effort normal and breath sounds normal.  Abdominal: Soft. She exhibits no distension. There is no tenderness.  Musculoskeletal: Normal range of motion.  Neurological: She is alert and oriented to person, place, and time.  Skin: Skin is warm and dry.  Psychiatric: She has a normal mood and affect. Judgment normal.     Dg Chest 2 View  08/05/2012  *RADIOLOGY REPORT*  Clinical Data: Chest pain  CHEST - 2 VIEW  Comparison:  None.  Findings:  The heart size and mediastinal contours are within normal limits.  Both lungs are clear.  The visualized skeletal structures are unremarkable.  IMPRESSION: No active cardiopulmonary disease.   Original Report Authenticated By: Judie Petit. Miles Costain, M.D.  US Abdomen Complete  08/05/2012  *RADIOLOGY REPORT*  Clinical Data:  Right upper quadrant abdominal pain  COMPLETE ABDOMINAL ULTRASOUND  Comparison:  Ultrasound the abdomen of 06/12/2012  Findings:  Gallbladder:  The gallbladder is visualized and no gallstones are noted.  There is no pain over the gallbladder with compression.  Common bile duct:  The common bile duct is normal measuring 2.6 mm in diameter.  Liver:  The liver has a normal echogenic pattern.  No ductal dilatation is seen.  IVC:  Appears normal.  Pancreas:  No focal abnormality seen.   Spleen:  The spleen is normal measuring 5.4 cm sagittally.  Right Kidney:  No hydronephrosis is seen.  The right kidney measures 10.1 cm sagittally.  Left Kidney:  No hydronephrosis is noted.  The left kidney measures 9.9 cm.  Abdominal aorta:  The abdominal aorta is normal in caliber, obscured by bowel gas distally.  IMPRESSION: Negative abdominal ultrasound.  No gallstones.  No ductal dilatation.   Original Report Authenticated By: Dwyane Dee, M.D.    Results for orders placed during the hospital encounter of 08/05/12  CBC      Component Value Range   WBC 6.9  4.0 - 10.5 K/uL   RBC 4.26  3.87 - 5.11 MIL/uL   Hemoglobin 9.3 (*) 12.0 - 15.0 g/dL   HCT 16.1 (*) 09.6 - 04.5 %   MCV 70.4 (*) 78.0 - 100.0 fL   MCH 21.8 (*) 26.0 - 34.0 pg   MCHC 31.0  30.0 - 36.0 g/dL   RDW 40.9 (*) 81.1 - 91.4 %   Platelets 406 (*) 150 - 400 K/uL  COMPREHENSIVE METABOLIC PANEL      Component Value Range   Sodium 138  135 - 145 mEq/L   Potassium 4.0  3.5 - 5.1 mEq/L   Chloride 103  96 - 112 mEq/L   CO2 27  19 - 32 mEq/L   Glucose, Bld 105 (*) 70 - 99 mg/dL   BUN 6  6 - 23 mg/dL   Creatinine, Ser 7.82  0.50 - 1.10 mg/dL   Calcium 9.5  8.4 - 95.6 mg/dL   Total Protein 7.4  6.0 - 8.3 g/dL   Albumin 3.2 (*) 3.5 - 5.2 g/dL   AST 17  0 - 37 U/L   ALT 9  0 - 35 U/L   Alkaline Phosphatase 76  39 - 117 U/L   Total Bilirubin 0.1 (*) 0.3 - 1.2 mg/dL   GFR calc non Af Amer >90  >90 mL/min   GFR calc Af Amer >90  >90 mL/min  LIPASE, BLOOD      Component Value Range   Lipase 16  11 - 59 U/L  HCG, SERUM, QUALITATIVE      Component Value Range   Preg, Serum NEGATIVE  NEGATIVE     1. Abdominal pain     MDM Symptoms improved in the ER. Doubt PE, doubt ACS. Likely GI related pain. Will recommend GI follow up. Told to return to ER for new or worsening symptoms   Lyanne Co, MD 08/18/12 [phone]

## 2012-08-13 ENCOUNTER — Encounter (HOSPITAL_COMMUNITY): Payer: Self-pay | Admitting: Emergency Medicine

## 2012-08-13 ENCOUNTER — Emergency Department (HOSPITAL_COMMUNITY): Payer: PRIVATE HEALTH INSURANCE

## 2012-08-13 ENCOUNTER — Emergency Department (HOSPITAL_COMMUNITY)
Admission: EM | Admit: 2012-08-13 | Discharge: 2012-08-13 | Disposition: A | Discharge status: Home / Self Care | Payer: PRIVATE HEALTH INSURANCE | Attending: Emergency Medicine | Admitting: Emergency Medicine

## 2012-08-13 DIAGNOSIS — Z8669 Personal history of other diseases of the nervous system and sense organs: Secondary | ICD-10-CM | POA: Insufficient documentation

## 2012-08-13 DIAGNOSIS — I10 Essential (primary) hypertension: Secondary | ICD-10-CM | POA: Insufficient documentation

## 2012-08-13 DIAGNOSIS — Z8711 Personal history of peptic ulcer disease: Secondary | ICD-10-CM | POA: Insufficient documentation

## 2012-08-13 DIAGNOSIS — R0789 Other chest pain: Secondary | ICD-10-CM | POA: Insufficient documentation

## 2012-08-13 DIAGNOSIS — Z79899 Other long term (current) drug therapy: Secondary | ICD-10-CM | POA: Insufficient documentation

## 2012-08-13 DIAGNOSIS — R079 Chest pain, unspecified: Secondary | ICD-10-CM

## 2012-08-13 DIAGNOSIS — K219 Gastro-esophageal reflux disease without esophagitis: Secondary | ICD-10-CM | POA: Insufficient documentation

## 2012-08-13 DIAGNOSIS — R1013 Epigastric pain: Secondary | ICD-10-CM | POA: Insufficient documentation

## 2012-08-13 LAB — CBC
HCT: 30.7 % — ABNORMAL LOW (ref 36.0–46.0)
MCH: 21.5 pg — ABNORMAL LOW (ref 26.0–34.0)
MCHC: 30.6 g/dL (ref 30.0–36.0)
MCV: 70.3 fL — ABNORMAL LOW (ref 78.0–100.0)
RDW: 17.6 % — ABNORMAL HIGH (ref 11.5–15.5)

## 2012-08-13 LAB — COMPREHENSIVE METABOLIC PANEL
ALT: 9 U/L (ref 0–35)
AST: 17 U/L (ref 0–37)
Alkaline Phosphatase: 70 U/L (ref 39–117)
CO2: 24 mEq/L (ref 19–32)
Chloride: 101 mEq/L (ref 96–112)
GFR calc non Af Amer: >90 mL/min (ref >90)
Potassium: 3.7 mEq/L (ref 3.5–5.1)
Total Bilirubin: 0.2 mg/dL — ABNORMAL LOW (ref 0.3–1.2)

## 2012-08-13 MED ORDER — HYDROCODONE-ACETAMINOPHEN 5-500 MG PO TABS: 1.0000 | ORAL_TABLET | Freq: Four times a day (QID) | ORAL | Status: DC | PRN

## 2012-08-13 MED ORDER — GI COCKTAIL ~~LOC~~
30.0000 mL | Freq: Once | ORAL | Status: AC
  Administered 2012-08-13: 30 mL via ORAL
  Filled 2012-08-13: qty 30

## 2012-08-13 MED ORDER — FAMOTIDINE 20 MG PO TABS
20.0000 mg | ORAL_TABLET | Freq: Once | ORAL | Status: AC
  Administered 2012-08-13: 20 mg via ORAL
  Filled 2012-08-13: qty 1

## 2012-08-13 MED ORDER — HYDROCODONE-ACETAMINOPHEN 5-325 MG PO TABS
2.0000 | ORAL_TABLET | Freq: Once | ORAL | Status: AC
  Administered 2012-08-13: 2 via ORAL
  Filled 2012-08-13: qty 2

## 2012-08-13 NOTE — ED Notes (Signed)
Pt states that she has a hx of these "episodes" of chest/back pain after she eats x 3 months.  States she has had 7 episodes.  Was seen at [facility] 3 wks ago and not given a dx.

## 2012-08-13 NOTE — ED Provider Notes (Signed)
History     CSN: [id number]  Arrival date & time 08/13/12  1028   First MD Initiated Contact with Patient 08/13/12 1108      Chief Complaint  Patient presents with  . Chest Pain    (Consider location/radiation/quality/duration/timing/severity/associated sxs/prior treatment) Patient is a 36 y.o. female presenting with chest pain. The history is provided by the patient.  Chest Pain Pertinent negatives for primary symptoms include no fever, no shortness of breath, no cough and no vomiting.   pt c/o epigastric, lower sternal midline cp for past 2-3 months. Constant, daily. Non radiating. Occasionally worse w eating. No relation to position whether upright or supine. Occurs at rest, no relation to activity or exertion. Has been seen in ed for same. No hx gallstones or pancreatitis. Remote hx stomach ulcer. No hx cad. No family hx cad. Non smoker, no cocaine use. No leg pain or swelling. No immobility, trauma or recent surgery. No hx dvt or pe. Denies hx gerd, bu states ?hx heartburn. No cough or uri c/o. No fever or chills. No mid to lower abd pain. No nvd. No gu c/o.      Past Medical History  Diagnosis Date  . Hypertension   . Carpal tunnel syndrome     Past Surgical History  Procedure Date  . Tubal ligation   . Carpal tunnel release     History reviewed. No pertinent family history.  History  Substance Use Topics  . Smoking status: Never Smoker   . Smokeless tobacco: Not on file  . Alcohol Use: No    OB History    Grav Para Term Preterm Abortions TAB SAB Ect Mult Living                  Review of Systems  Constitutional: Negative for fever and chills.  HENT: Negative for neck pain.   Eyes: Negative for redness.  Respiratory: Negative for cough and shortness of breath.   Cardiovascular: Positive for chest pain.  Gastrointestinal: Negative for vomiting, diarrhea and constipation.  Genitourinary: Negative for flank pain.  Musculoskeletal: Negative for back pain.   Skin: Negative for rash.  Neurological: Negative for headaches.  Hematological: Does not bruise/bleed easily.  Psychiatric/Behavioral: Negative for confusion.    Allergies  Aspirin and Ibuprofen  Home Medications   Current Outpatient Rx  Name  Route  Sig  Dispense  Refill  . ACETAMINOPHEN 500 MG PO TABS   Oral   Take 1,000 mg by mouth every 4 (four) hours as needed. For pain.         . CYCLOBENZAPRINE HCL 10 MG PO TABS   Oral   Take 1 tablet (10 mg total) by mouth 2 (two) times daily as needed for muscle spasms.   20 tablet   0   . HYDROCODONE-ACETAMINOPHEN 5-325 MG PO TABS   Oral   Take 1 tablet by mouth every 4 (four) hours as needed for pain.   15 tablet   0   . OMEPRAZOLE 20 MG PO CPDR   Oral   Take 1 capsule (20 mg total) by mouth daily.   30 capsule   0   . ONDANSETRON 8 MG PO TBDP   Oral   Take 1 tablet (8 mg total) by mouth every 8 (eight) hours as needed for nausea.   12 tablet   0   . TRAMADOL HCL 50 MG PO TABS   Oral   Take 50 mg by mouth every 6 (six) hours as  needed. For pain           BP 165/80  Pulse 95  Temp 98.6 F (37 C) (Oral)  Resp 16  SpO2 100%  LMP 07/21/2012  Physical Exam  Nursing note and vitals reviewed. Constitutional: She is oriented to person, place, and time. She appears well-developed and well-nourished. No distress.  HENT:  Mouth/Throat: Oropharynx is clear and moist.  Eyes: Conjunctivae normal are normal. No scleral icterus.  Neck: Neck supple. No tracheal deviation present.  Cardiovascular: Normal rate, regular rhythm, normal heart sounds and intact distal pulses.  Exam reveals no gallop and no friction rub.   No murmur heard. Pulmonary/Chest: Effort normal and breath sounds normal. No respiratory distress. She exhibits no tenderness.  Abdominal: Soft. Normal appearance. She exhibits no distension and no mass. There is no rebound and no guarding.       Mild epigastric tenderness.   Genitourinary:       No cva  tenderness  Musculoskeletal: She exhibits no edema and no tenderness.  Neurological: She is alert and oriented to person, place, and time.  Skin: Skin is warm and dry. No rash noted.  Psychiatric: She has a normal mood and affect.    ED Course  Procedures (including critical care time)   Results for orders placed during the hospital encounter of 08/13/12  CBC      Component Value Range   WBC 6.7  4.0 - 10.5 K/uL   RBC 4.37  3.87 - 5.11 MIL/uL   Hemoglobin 9.4 (*) 12.0 - 15.0 g/dL   HCT 16.1 (*) 09.6 - 04.5 %   MCV 70.3 (*) 78.0 - 100.0 fL   MCH 21.5 (*) 26.0 - 34.0 pg   MCHC 30.6  30.0 - 36.0 g/dL   RDW 40.9 (*) 81.1 - 91.4 %   Platelets 377  150 - 400 K/uL  COMPREHENSIVE METABOLIC PANEL      Component Value Range   Sodium 136  135 - 145 mEq/L   Potassium 3.7  3.5 - 5.1 mEq/L   Chloride 101  96 - 112 mEq/L   CO2 24  19 - 32 mEq/L   Glucose, Bld 70  70 - 99 mg/dL   BUN 5 (*) 6 - 23 mg/dL   Creatinine, Ser 7.82  0.50 - 1.10 mg/dL   Calcium 9.3  8.4 - 95.6 mg/dL   Total Protein 7.6  6.0 - 8.3 g/dL   Albumin 3.2 (*) 3.5 - 5.2 g/dL   AST 17  0 - 37 U/L   ALT 9  0 - 35 U/L   Alkaline Phosphatase 70  39 - 117 U/L   Total Bilirubin 0.2 (*) 0.3 - 1.2 mg/dL   GFR calc non Af Amer >90  >90 mL/min   GFR calc Af Amer >90  >90 mL/min  LIPASE, BLOOD      Component Value Range   Lipase 13  11 - 59 U/L  TROPONIN I      Component Value Range   Troponin I <0.30  <0.30 ng/mL   Dg Chest 2 View  08/13/2012  *RADIOLOGY REPORT*  Clinical Data: Chest pain, epigastric pain  CHEST - 2 VIEW  Comparison:  08/05/2012  Findings:  The heart size and mediastinal contours are within normal limits.  Both lungs are clear.  The visualized skeletal structures are unremarkable.  IMPRESSION: No active cardiopulmonary disease.   Original Report Authenticated By: Judie Petit. Miles Costain, M.D.    Dg Chest 2 View  08/05/2012  *  RADIOLOGY REPORT*  Clinical Data: Chest pain  CHEST - 2 VIEW  Comparison:  None.  Findings:   The heart size and mediastinal contours are within normal limits.  Both lungs are clear.  The visualized skeletal structures are unremarkable.  IMPRESSION: No active cardiopulmonary disease.   Original Report Authenticated By: Judie Petit. Miles Costain, M.D.    US Abdomen Complete  08/05/2012  *RADIOLOGY REPORT*  Clinical Data:  Right upper quadrant abdominal pain  COMPLETE ABDOMINAL ULTRASOUND  Comparison:  Ultrasound the abdomen of 06/12/2012  Findings:  Gallbladder:  The gallbladder is visualized and no gallstones are noted.  There is no pain over the gallbladder with compression.  Common bile duct:  The common bile duct is normal measuring 2.6 mm in diameter.  Liver:  The liver has a normal echogenic pattern.  No ductal dilatation is seen.  IVC:  Appears normal.  Pancreas:  No focal abnormality seen.  Spleen:  The spleen is normal measuring 5.4 cm sagittally.  Right Kidney:  No hydronephrosis is seen.  The right kidney measures 10.1 cm sagittally.  Left Kidney:  No hydronephrosis is noted.  The left kidney measures 9.9 cm.  Abdominal aorta:  The abdominal aorta is normal in caliber, obscured by bowel gas distally.  IMPRESSION: Negative abdominal ultrasound.  No gallstones.  No ductal dilatation.   Original Report Authenticated By: Dwyane Dee, M.D.       MDM  Labs. Ecg.  Reviewed nursing notes and prior charts for additional history.   Recent u/s for same symptoms negative for gallstones.   Date: 08/13/2012  Rate: 88  Rhythm: normal sinus rhythm  QRS Axis: normal  Intervals: normal  ST/T Wave abnormalities: nonspecific T wave changes  Conduction Disutrbances:none  Narrative Interpretation:   Old EKG Reviewed: changes noted   After symptoms being constant x >24 hours, and recurrent for a few months, trop neg. cxr neg acute. Recent u/s neg acute. ?esophagitis/reflux. Discussed need pcp f/u, also discussed referral to GI for consideration possible egd.   Recheck pt comfortable.       Suzi Roots, MD 08/13/12 1236

## 2012-12-23 ENCOUNTER — Emergency Department (HOSPITAL_COMMUNITY)
Admission: EM | Admit: 2012-12-23 | Discharge: 2012-12-23 | Disposition: A | Discharge status: Home / Self Care | Payer: PRIVATE HEALTH INSURANCE | Attending: Emergency Medicine | Admitting: Emergency Medicine

## 2012-12-23 ENCOUNTER — Encounter (HOSPITAL_COMMUNITY): Payer: Self-pay | Admitting: Emergency Medicine

## 2012-12-23 DIAGNOSIS — X58XXXA Exposure to other specified factors, initial encounter: Secondary | ICD-10-CM | POA: Insufficient documentation

## 2012-12-23 DIAGNOSIS — Z8669 Personal history of other diseases of the nervous system and sense organs: Secondary | ICD-10-CM | POA: Insufficient documentation

## 2012-12-23 DIAGNOSIS — I10 Essential (primary) hypertension: Secondary | ICD-10-CM | POA: Insufficient documentation

## 2012-12-23 DIAGNOSIS — S335XXA Sprain of ligaments of lumbar spine, initial encounter: Secondary | ICD-10-CM | POA: Insufficient documentation

## 2012-12-23 DIAGNOSIS — S39012A Strain of muscle, fascia and tendon of lower back, initial encounter: Secondary | ICD-10-CM

## 2012-12-23 DIAGNOSIS — Y9389 Activity, other specified: Secondary | ICD-10-CM | POA: Insufficient documentation

## 2012-12-23 DIAGNOSIS — Y929 Unspecified place or not applicable: Secondary | ICD-10-CM | POA: Insufficient documentation

## 2012-12-23 MED ORDER — HYDROCODONE-ACETAMINOPHEN 5-325 MG PO TABS: 1.0000 | ORAL_TABLET | Freq: Four times a day (QID) | ORAL | Status: DC | PRN

## 2012-12-23 MED ORDER — PREDNISONE 20 MG PO TABS
60.0000 mg | ORAL_TABLET | Freq: Once | ORAL | Status: AC
  Administered 2012-12-23: 60 mg via ORAL
  Filled 2012-12-23: qty 3

## 2012-12-23 MED ORDER — CYCLOBENZAPRINE HCL 10 MG PO TABS: 10.0000 mg | ORAL_TABLET | Freq: Three times a day (TID) | ORAL | Status: DC | PRN

## 2012-12-23 MED ORDER — PREDNISONE 50 MG PO TABS: 50.0000 mg | ORAL_TABLET | Freq: Every day | ORAL | Status: DC

## 2012-12-23 NOTE — ED Provider Notes (Signed)
History     CSN: [id number]  Arrival date & time 12/23/12  4098   First MD Initiated Contact with Patient 12/23/12 1016      Chief Complaint  Patient presents with  . Back Pain    (Consider location/radiation/quality/duration/timing/severity/associated sxs/prior treatment) HPI Patient presents to the emergency department with low back pain that began 4 days, ago.  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  Patient denies numbness, weakness, chest pain, shortness of breath, nausea, vomiting, abdominal pain, fever, or dysuria.  Patient, states she took Tylenol without relief.  Patient, states, that movement and palpation make the pain, worse.  She states that sitting still makes the pain, better.  Patient, states, at times, the pain will radiate to her left leg. Past Medical History  Diagnosis Date  . Hypertension   . Carpal tunnel syndrome     Past Surgical History  Procedure Laterality Date  . Tubal ligation    . Carpal tunnel release      History reviewed. No pertinent family history.  History  Substance Use Topics  . Smoking status: Never Smoker   . Smokeless tobacco: Not on file  . Alcohol Use: No    OB History   Grav Para Term Preterm Abortions TAB SAB Ect Mult Living                  Review of Systems All other systems negative except as documented in the HPI. All pertinent positives and negatives as reviewed in the HPI. Allergies  Aspirin; Darvocet; and Ibuprofen  Home Medications   Current Outpatient Rx  Name  Route  Sig  Dispense  Refill  . acetaminophen (TYLENOL) 500 MG tablet   Oral   Take 1,000 mg by mouth every 4 (four) hours as needed. For pain.         . cyclobenzaprine (FLEXERIL) 10 MG tablet   Oral   Take 1 tablet (10 mg total) by mouth 2 (two) times daily as needed for muscle spasms.   20 tablet   0   . traMADol (ULTRAM) 50 MG tablet   Oral   Take 50 mg by mouth every 6 (six) hours as needed. For  pain           BP 156/93  Pulse 90  Temp(Src) 97.5 F (36.4 C) (Oral)  Resp 18  SpO2 100%  Physical Exam  Constitutional: She is oriented to person, place, and time. She appears well-developed and well-nourished.  HENT:  Head: Normocephalic and atraumatic.  Cardiovascular: Normal rate and regular rhythm.   Pulmonary/Chest: Effort normal and breath sounds normal.  Musculoskeletal:       Back:  Neurological: She is alert and oriented to person, place, and time. She has normal strength. She displays normal reflexes. No sensory deficit. She exhibits normal muscle tone. Coordination and gait normal.  Reflex Scores:      Patellar reflexes are 2+ on the right side and 2+ on the left side.      Achilles reflexes are 2+ on the right side and 2+ on the left side.   ED Course  Procedures (including critical care time)  The patient be treated for lumbar strain based on her history of present illness, and physical exam findings.  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  She has normal reflexes and motor, strength in  her lower extremities.   MDM         Carlyle Dolly, PA-C 12/23/12 1056

## 2012-12-23 NOTE — ED Notes (Signed)
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

## 2012-12-23 NOTE — ED Provider Notes (Signed)
Medical screening examination/treatment/procedure(s) were performed by non-physician practitioner and as supervising physician I was immediately available for consultation/collaboration.  Maryanne Huneycutt R. Yanira Tolsma, MD 12/23/12 1544 

## 2013-10-06 ENCOUNTER — Emergency Department (HOSPITAL_COMMUNITY)
Admission: EM | Admit: 2013-10-06 | Discharge: 2013-10-06 | Disposition: A | Discharge status: Home / Self Care | Payer: PRIVATE HEALTH INSURANCE | Attending: Emergency Medicine | Admitting: Emergency Medicine

## 2013-10-06 ENCOUNTER — Encounter (HOSPITAL_COMMUNITY): Payer: Self-pay | Admitting: Emergency Medicine

## 2013-10-06 DIAGNOSIS — Z8669 Personal history of other diseases of the nervous system and sense organs: Secondary | ICD-10-CM | POA: Insufficient documentation

## 2013-10-06 DIAGNOSIS — I10 Essential (primary) hypertension: Secondary | ICD-10-CM | POA: Insufficient documentation

## 2013-10-06 DIAGNOSIS — M542 Cervicalgia: Secondary | ICD-10-CM

## 2013-10-06 MED ORDER — HYDROCODONE-ACETAMINOPHEN 5-325 MG PO TABS: 2.0000 | ORAL_TABLET | ORAL | Status: DC | PRN

## 2013-10-06 MED ORDER — KETOROLAC TROMETHAMINE 60 MG/2ML IM SOLN
60.0000 mg | Freq: Once | INTRAMUSCULAR | Status: AC
  Administered 2013-10-06: 60 mg via INTRAMUSCULAR
  Filled 2013-10-06: qty 2

## 2013-10-06 MED ORDER — METHOCARBAMOL 500 MG PO TABS: 500.0000 mg | ORAL_TABLET | Freq: Two times a day (BID) | ORAL | Status: DC | PRN

## 2013-10-06 NOTE — ED Notes (Signed)
Pt reports right neck and shoulder pain since last Thurday. Reports pain radiates to her right ear as well. States has tried home tylenol and flexeril with little relief. Denies fever, dizziness, HOH, ringing in ears. Pt alert, oriented x4, NAD at present.

## 2013-10-06 NOTE — ED Provider Notes (Signed)
CSN: [id number]     Arrival date & time 10/06/13  [id number] History   First MD Initiated Contact with Patient 10/06/13 [phone]     Chief Complaint  Patient presents with  . Torticollis   (Consider location/radiation/quality/duration/timing/severity/associated sxs/prior Treatment) HPI Comments: Patient is a 38 year old female with no significant past medical history who presents with neck pain that started 5 days ago. Patient reports noticing the pain when she woke up one morning. The pain is mostly located on the right side of her neck and shoulder and radiated to her right ear. Palpation of the area makes the pain worse. No alleviating factors. Patient has tried flexeril and tylenol for pain. Patient denies any injury. No other associated symptoms such as fever or headache.    Past Medical History  Diagnosis Date  . Hypertension   . Carpal tunnel syndrome    Past Surgical History  Procedure Laterality Date  . Tubal ligation    . Carpal tunnel release     History reviewed. No pertinent family history. History  Substance Use Topics  . Smoking status: Never Smoker   . Smokeless tobacco: Not on file  . Alcohol Use: No   OB History   Grav Para Term Preterm Abortions TAB SAB Ect Mult Living                 Review of Systems  Constitutional: Negative for fever, chills and fatigue.  HENT: Negative for trouble swallowing.   Eyes: Negative for visual disturbance.  Respiratory: Negative for shortness of breath.   Cardiovascular: Negative for chest pain and palpitations.  Gastrointestinal: Negative for nausea, vomiting, abdominal pain and diarrhea.  Genitourinary: Negative for dysuria and difficulty urinating.  Musculoskeletal: Positive for neck pain. Negative for arthralgias.  Skin: Negative for color change.  Neurological: Negative for dizziness and weakness.  Psychiatric/Behavioral: Negative for dysphoric mood.    Allergies  Aspirin; Darvocet; and Ibuprofen  Home Medications    Current Outpatient Rx  Name  Route  Sig  Dispense  Refill  . acetaminophen (TYLENOL) 500 MG tablet   Oral   Take 1,000 mg by mouth every 4 (four) hours as needed. For pain.         . cyclobenzaprine (FLEXERIL) 10 MG tablet   Oral   Take 1 tablet (10 mg total) by mouth 2 (two) times daily as needed for muscle spasms.   20 tablet   0    BP 154/86  Pulse 87  Temp(Src) 98.1 F (36.7 C) (Oral)  Resp 17  SpO2 100%  LMP 09/28/2013 Physical Exam  Nursing note and vitals reviewed. Constitutional: She is oriented to person, place, and time. She appears well-developed and well-nourished. No distress.  HENT:  Head: Normocephalic and atraumatic.  Eyes: Conjunctivae and EOM are normal.  Neck:  ROM limited slightly due to right side neck pain.   Cardiovascular: Normal rate and regular rhythm.  Exam reveals no gallop and no friction rub.   No murmur heard. Pulmonary/Chest: Effort normal and breath sounds normal. She has no wheezes. She has no rales. She exhibits no tenderness.  Abdominal: Soft.  Musculoskeletal: Normal range of motion.  Neurological: She is alert and oriented to person, place, and time. Coordination normal.  No meningeal signs. Speech is goal-oriented. Moves limbs without ataxia.   Skin: Skin is warm and dry.  Psychiatric: She has a normal mood and affect. Her behavior is normal.    ED Course  Procedures (including critical care time) Labs  Review Labs Reviewed - No data to display Imaging Review No results found.  EKG Interpretation   None       MDM   1. Neck pain     7:35 AM Patient's neck pain likely due to muscle pain. Patient will have IM toradol here and be discharged with vicodin and robaxin. Patient will follow up with her PCP for further evaluation. Patient denies any injury so no imaging indicated at this time. Vitals stable and patient afebrile.   Emilia Beck, [place] 10/06/13 [phone]

## 2013-10-06 NOTE — Discharge Instructions (Signed)
Take vicodin as needed for pain. Take robaxin as needed for muscle spasm. Refer to attached documents for more information. Follow up with your doctor as needed. Continue to apply heat to the affected area.

## 2013-10-07 NOTE — ED Provider Notes (Signed)
Medical screening examination/treatment/procedure(s) were performed by non-physician practitioner and as supervising physician I was immediately available for consultation/collaboration.  EKG Interpretation   None         Suzi RootsKevin E Mcgregor Tinnon, MD 10/07/13 [phone]

## 2013-10-18 ENCOUNTER — Encounter (HOSPITAL_COMMUNITY): Payer: Self-pay | Admitting: Emergency Medicine

## 2013-10-18 ENCOUNTER — Emergency Department (HOSPITAL_COMMUNITY)
Admission: EM | Admit: 2013-10-18 | Discharge: 2013-10-18 | Disposition: A | Discharge status: Home / Self Care | Payer: PRIVATE HEALTH INSURANCE | Attending: Emergency Medicine | Admitting: Emergency Medicine

## 2013-10-18 DIAGNOSIS — R110 Nausea: Secondary | ICD-10-CM | POA: Insufficient documentation

## 2013-10-18 DIAGNOSIS — I10 Essential (primary) hypertension: Secondary | ICD-10-CM | POA: Insufficient documentation

## 2013-10-18 DIAGNOSIS — Z8719 Personal history of other diseases of the digestive system: Secondary | ICD-10-CM | POA: Insufficient documentation

## 2013-10-18 DIAGNOSIS — Z8669 Personal history of other diseases of the nervous system and sense organs: Secondary | ICD-10-CM | POA: Insufficient documentation

## 2013-10-18 DIAGNOSIS — R1013 Epigastric pain: Secondary | ICD-10-CM

## 2013-10-18 DIAGNOSIS — Z3202 Encounter for pregnancy test, result negative: Secondary | ICD-10-CM | POA: Insufficient documentation

## 2013-10-18 HISTORY — DX: Personal history of peptic ulcer disease: Z87.11

## 2013-10-18 HISTORY — DX: Personal history of other diseases of the digestive system: Z87.19

## 2013-10-18 LAB — COMPREHENSIVE METABOLIC PANEL
ALBUMIN: 3.6 g/dL (ref 3.5–5.2)
ALK PHOS: 72 U/L (ref 39–117)
ALT: 10 U/L (ref 0–35)
AST: 17 U/L (ref 0–37)
BUN: 11 mg/dL (ref 6–23)
CO2: 28 mEq/L (ref 19–32)
Calcium: 9.2 mg/dL (ref 8.4–10.5)
Chloride: 101 mEq/L (ref 96–112)
Creatinine, Ser: 0.92 mg/dL (ref 0.50–1.10)
GFR calc Af Amer: 90 mL/min — ABNORMAL LOW (ref >90)
GFR calc non Af Amer: 77 mL/min — ABNORMAL LOW (ref >90)
Glucose, Bld: 93 mg/dL (ref 70–99)
POTASSIUM: 4 meq/L (ref 3.7–5.3)
SODIUM: 140 meq/L (ref 137–147)
TOTAL PROTEIN: 8.2 g/dL (ref 6.0–8.3)
Total Bilirubin: 0.2 mg/dL — ABNORMAL LOW (ref 0.3–1.2)

## 2013-10-18 LAB — CBC WITH DIFFERENTIAL/PLATELET
BASOS ABS: 0 10*3/uL (ref 0.0–0.1)
BASOS PCT: 0 % (ref 0–1)
EOS ABS: 0.2 10*3/uL (ref 0.0–0.7)
Eosinophils Relative: 3 % (ref 0–5)
HCT: 33.1 % — ABNORMAL LOW (ref 36.0–46.0)
Hemoglobin: 10.1 g/dL — ABNORMAL LOW (ref 12.0–15.0)
Lymphocytes Relative: 30 % (ref 12–46)
Lymphs Abs: 2.2 10*3/uL (ref 0.7–4.0)
MCH: 23.3 pg — AB (ref 26.0–34.0)
MCHC: 30.5 g/dL (ref 30.0–36.0)
MCV: 76.3 fL — ABNORMAL LOW (ref 78.0–100.0)
Monocytes Absolute: 0.5 10*3/uL (ref 0.1–1.0)
Monocytes Relative: 7 % (ref 3–12)
NEUTROS PCT: 60 % (ref 43–77)
Neutro Abs: 4.4 10*3/uL (ref 1.7–7.7)
Platelets: 404 10*3/uL — ABNORMAL HIGH (ref 150–400)
RBC: 4.34 MIL/uL (ref 3.87–5.11)
RDW: 16.1 % — ABNORMAL HIGH (ref 11.5–15.5)
WBC: 7.3 10*3/uL (ref 4.0–10.5)

## 2013-10-18 LAB — URINALYSIS, ROUTINE W REFLEX MICROSCOPIC
BILIRUBIN URINE: NEGATIVE
Glucose, UA: NEGATIVE mg/dL
Ketones, ur: NEGATIVE mg/dL
Leukocytes, UA: NEGATIVE
NITRITE: NEGATIVE
PROTEIN: NEGATIVE mg/dL
UROBILINOGEN UA: 0.2 mg/dL (ref 0.0–1.0)
pH: 7 (ref 5.0–8.0)

## 2013-10-18 LAB — URINE MICROSCOPIC-ADD ON

## 2013-10-18 LAB — LIPASE, BLOOD: Lipase: 20 U/L (ref 11–59)

## 2013-10-18 MED ORDER — ONDANSETRON HCL 4 MG/2ML IJ SOLN
4.0000 mg | Freq: Once | INTRAMUSCULAR | Status: AC
  Administered 2013-10-18: 4 mg via INTRAVENOUS
  Filled 2013-10-18: qty 2

## 2013-10-18 MED ORDER — OMEPRAZOLE 20 MG PO CPDR: 20.0000 mg | DELAYED_RELEASE_CAPSULE | Freq: Every day | ORAL | Status: DC

## 2013-10-18 MED ORDER — PANTOPRAZOLE SODIUM 40 MG IV SOLR
40.0000 mg | Freq: Once | INTRAVENOUS | Status: AC
  Administered 2013-10-18: 40 mg via INTRAVENOUS
  Filled 2013-10-18: qty 40

## 2013-10-18 MED ORDER — ONDANSETRON HCL 8 MG PO TABS: 8.0000 mg | ORAL_TABLET | Freq: Three times a day (TID) | ORAL | Status: DC | PRN

## 2013-10-18 MED ORDER — HYDROMORPHONE HCL PF 1 MG/ML IJ SOLN
1.0000 mg | Freq: Once | INTRAMUSCULAR | Status: AC
  Administered 2013-10-18: 1 mg via INTRAVENOUS
  Filled 2013-10-18: qty 1

## 2013-10-18 NOTE — ED Notes (Signed)
Pt reporting epigastric pain that radiates to right side.  Reporting nausea, denies vomiting or fever. Reports symptoms for past 2 days.

## 2013-10-18 NOTE — ED Provider Notes (Signed)
Urine pregnancy negative   Joya Gaskinsonald W Mathhew Buysse, MD 10/18/13 [phone]

## 2013-10-18 NOTE — ED Provider Notes (Signed)
CSN: [id number]     Arrival date & time 10/18/13  0534 History   First MD Initiated Contact with Patient 10/18/13 0544     Chief Complaint  Patient presents with  . Abdominal Pain   Patient is a 38 y.o. female presenting with abdominal pain. The history is provided by the patient.  Abdominal Pain Pain location:  Epigastric Pain quality: sharp   Pain radiates to:  R flank Pain severity:  Moderate Onset quality:  Gradual Duration:  2 days Timing:  Intermittent Progression:  Worsening Chronicity:  Recurrent Relieved by:  Nothing Worsened by:  Palpation Associated symptoms: nausea   Associated symptoms: no chest pain, no diarrhea, no dysuria, no fever, no melena, no shortness of breath and no vomiting   pt reports similar to pain when she had an ulcer She denies rectal bleeding/melena No cp/sob No h/o abdominal surgery  Past Medical History  Diagnosis Date  . Hypertension   . Carpal tunnel syndrome   . History of stomach ulcers    Past Surgical History  Procedure Laterality Date  . Tubal ligation    . Carpal tunnel release     History reviewed. No pertinent family history. History  Substance Use Topics  . Smoking status: Never Smoker   . Smokeless tobacco: Not on file  . Alcohol Use: No   OB History   Grav Para Term Preterm Abortions TAB SAB Ect Mult Living                 Review of Systems  Constitutional: Negative for fever.  Respiratory: Negative for shortness of breath.   Cardiovascular: Negative for chest pain.  Gastrointestinal: Positive for nausea and abdominal pain. Negative for vomiting, diarrhea and melena.  Genitourinary: Negative for dysuria.  All other systems reviewed and are negative.    Allergies  Aspirin; Darvocet; and Ibuprofen  Home Medications   Current Outpatient Rx  Name  Route  Sig  Dispense  Refill  . acetaminophen (TYLENOL) 500 MG tablet   Oral   Take 1,000 mg by mouth every 4 (four) hours as needed. For pain.         .  cyclobenzaprine (FLEXERIL) 10 MG tablet   Oral   Take 1 tablet (10 mg total) by mouth 2 (two) times daily as needed for muscle spasms.   20 tablet   0   . HYDROcodone-acetaminophen (NORCO/VICODIN) 5-325 MG per tablet   Oral   Take 2 tablets by mouth every 4 (four) hours as needed.   12 tablet   0   . methocarbamol (ROBAXIN) 500 MG tablet   Oral   Take 1 tablet (500 mg total) by mouth 2 (two) times daily as needed for muscle spasms.   20 tablet   0    BP 152/84  Pulse 72  Temp(Src) 98 F (36.7 C)  Resp 20  Ht 5\' 2"  (1.575 m)  Wt 240 lb (108.863 kg)  BMI 43.89 kg/m2  SpO2 100%  LMP 10/18/2013 Physical Exam CONSTITUTIONAL: Well developed/well nourished, she appears uncomfortable HEAD: Normocephalic/atraumatic EYES: EOMI/PERRL ENMT: Mucous membranes moist NECK: supple no meningeal signs SPINE:entire spine nontender CV: S1/S2 noted, no murmurs/rubs/gallops noted LUNGS: Lungs are clear to auscultation bilaterally, no apparent distress ABDOMEN: soft, moderate epigastric tenderness, no rebound or guarding GU:no cva tenderness NEURO: Pt is awake/alert, moves all extremitiesx4 EXTREMITIES: pulses normal, full ROM SKIN: warm, color normal PSYCH: no abnormalities of mood noted  ED Course  Procedures (including critical care time)  7:57  AM Pt reports her pain is completely resolved On repeat exam, her abdomen is soft without tenderness/rebound/guarding I feel she is safe/stable for d/c home Will restart prilosec We discussed strict return precautions  Labs Review Labs Reviewed  URINALYSIS, ROUTINE W REFLEX MICROSCOPIC  CBC WITH DIFFERENTIAL  COMPREHENSIVE METABOLIC PANEL  LIPASE, BLOOD   Imaging Review No results found.  EKG Interpretation   None        Date: 10/18/2013  Rate: 67  Rhythm: normal sinus rhythm  QRS Axis: normal  Intervals: normal  ST/T Wave abnormalities: nonspecific ST changes  Conduction Disutrbances:none  Narrative Interpretation:    Old EKG Reviewed: unchanged from prior    MDM  No diagnosis found. Nursing notes including past medical history and social history reviewed and considered in documentation Labs/vital reviewed and considered     Joya Gaskinsonald W Sarahgrace Broman, MD 10/18/13 [phone]

## 2013-10-18 NOTE — Discharge Instructions (Signed)

## 2013-10-19 LAB — POCT PREGNANCY, URINE: PREG TEST UR: NEGATIVE

## 2014-01-25 ENCOUNTER — Emergency Department (HOSPITAL_COMMUNITY)
Admission: EM | Admit: 2014-01-25 | Discharge: 2014-01-25 | Disposition: A | Discharge status: Home / Self Care | Payer: PRIVATE HEALTH INSURANCE | Attending: Emergency Medicine | Admitting: Emergency Medicine

## 2014-01-25 ENCOUNTER — Encounter (HOSPITAL_COMMUNITY): Payer: Self-pay | Admitting: Emergency Medicine

## 2014-01-25 DIAGNOSIS — M545 Low back pain, unspecified: Secondary | ICD-10-CM | POA: Insufficient documentation

## 2014-01-25 DIAGNOSIS — I10 Essential (primary) hypertension: Secondary | ICD-10-CM | POA: Insufficient documentation

## 2014-01-25 DIAGNOSIS — Z8719 Personal history of other diseases of the digestive system: Secondary | ICD-10-CM | POA: Insufficient documentation

## 2014-01-25 DIAGNOSIS — Z9889 Other specified postprocedural states: Secondary | ICD-10-CM | POA: Insufficient documentation

## 2014-01-25 DIAGNOSIS — Z8669 Personal history of other diseases of the nervous system and sense organs: Secondary | ICD-10-CM | POA: Insufficient documentation

## 2014-01-25 LAB — URINALYSIS, ROUTINE W REFLEX MICROSCOPIC
Bilirubin Urine: NEGATIVE
Glucose, UA: NEGATIVE mg/dL
HGB URINE DIPSTICK: NEGATIVE
Ketones, ur: NEGATIVE mg/dL
Leukocytes, UA: NEGATIVE
Nitrite: NEGATIVE
PROTEIN: NEGATIVE mg/dL
Specific Gravity, Urine: 1.01 (ref 1.005–1.030)
UROBILINOGEN UA: 0.2 mg/dL (ref 0.0–1.0)
pH: 6 (ref 5.0–8.0)

## 2014-01-25 MED ORDER — CYCLOBENZAPRINE HCL 10 MG PO TABS
10.0000 mg | ORAL_TABLET | Freq: Once | ORAL | Status: AC
  Administered 2014-01-25: 10 mg via ORAL

## 2014-01-25 MED ORDER — CYCLOBENZAPRINE HCL 10 MG PO TABS: 10.0000 mg | ORAL_TABLET | Freq: Two times a day (BID) | ORAL | Status: DC | PRN

## 2014-01-25 NOTE — Discharge Instructions (Signed)
Your urine was negative for blood and no signs of infection. Do not take the medication if you are driving as it will make you sleepy. Follow up with Dr. Romeo AppleHarrison if your back pain persist. Return here as needed.

## 2014-01-25 NOTE — ED Notes (Signed)
Low back pain with radiaition down lt leg, Onset Friday,  No known  Injury at onset.  [place]

## 2014-01-25 NOTE — ED Provider Notes (Signed)
CSN: [id number]     Arrival date & time 01/25/14  1104 History  This chart was scribed for non-physician practitioner, Kerrie BuffaloHope Peityn Payton, FNP,working with Joya Gaskinsonald W Wickline, MD, by Karle PlumberJennifer Tensley, ED Scribe.  This patient was seen in room APFT20/APFT20 and the patient's care was started at 11:47 AM.  Chief Complaint  Patient presents with  . Back Pain   The history is provided by the patient. No language interpreter was used.   HPI Comments:  Marissa Simpson is a 38 y.o. obese female who presents to the Emergency Department complaining of severe lower left-sided back pain that started three days ago. Pt states that upon waking on Friday she thought she was passing a kidney stone. She states the next day she shampooed her carpets and slipped on her wet kitchen floor and the pain worsened. She reports associated left leg tingling. Pt reports walking and other movements make her pain worse. She states she has taken Tylenol with no relief. She denies neck pain, bowel or bladder incontinence, numbness or weakness of lower extremities.   Past Medical History  Diagnosis Date  . Hypertension   . Carpal tunnel syndrome   . History of stomach ulcers    Past Surgical History  Procedure Laterality Date  . Tubal ligation    . Carpal tunnel release     History reviewed. No pertinent family history. History  Substance Use Topics  . Smoking status: Never Smoker   . Smokeless tobacco: Not on file  . Alcohol Use: No   OB History   Grav Para Term Preterm Abortions TAB SAB Ect Mult Living                 Review of Systems  Genitourinary: Negative for difficulty urinating.  Musculoskeletal: Positive for back pain. Negative for neck pain.  Neurological: Negative for weakness and numbness.  All other systems reviewed and are negative.   Allergies  Aspirin; Darvocet; and Ibuprofen  Home Medications   Prior to Admission medications   Medication Sig Start Date End Date Taking? Authorizing Provider   acetaminophen (TYLENOL) 500 MG tablet Take 1,000 mg by mouth every 4 (four) hours as needed for moderate pain.   Yes Historical Provider, MD   Triage Vitals: BP 139/86  Pulse 76  Temp(Src) 98.3 F (36.8 C) (Oral)  Resp 18  Ht 5\' 3"  (1.6 m)  Wt 242 lb (109.77 kg)  BMI 42.88 kg/m2  SpO2 100%  LMP 01/15/2014 Physical Exam  Nursing note and vitals reviewed. Constitutional: She is oriented to person, place, and time. She appears well-developed and well-nourished. No distress.  HENT:  Head: Normocephalic and atraumatic.  Right Ear: Tympanic membrane normal.  Left Ear: Tympanic membrane normal.  Nose: Nose normal.  Mouth/Throat: Uvula is midline, oropharynx is clear and moist and mucous membranes are normal.  Eyes: EOM are normal.  Neck: Normal range of motion. Neck supple.  No cervical spine tenderness.  Cardiovascular: Normal rate, regular rhythm and normal heart sounds.  Exam reveals no gallop and no friction rub.   No murmur heard. Pedal pulses strong and equal bilaterally.  Pulmonary/Chest: Effort normal and breath sounds normal. No respiratory distress. She has no wheezes. She has no rales.  Abdominal: Soft. Bowel sounds are normal. There is no tenderness.  Genitourinary:  No CVA tenderness.  Musculoskeletal: Normal range of motion.       Lumbar back: She exhibits tenderness, pain and spasm. She exhibits normal pulse.  No midline spine tenderness. Normal  SLR without difficulty.  Neurological: She is alert and oriented to person, place, and time. She has normal strength and normal reflexes. She displays normal reflexes. No cranial nerve deficit or sensory deficit. Gait normal.  Reflex Scores:      Bicep reflexes are 2+ on the right side and 2+ on the left side.      Brachioradialis reflexes are 2+ on the right side and 2+ on the left side.      Patellar reflexes are 2+ on the right side and 2+ on the left side.      Achilles reflexes are 2+ on the right side and 2+ on the left  side. Good strength upper and lower extremities. Normal gait without foot drag.  Skin: Skin is warm and dry.  Psychiatric: She has a normal mood and affect. Her behavior is normal.    ED Course  Procedures (including critical care time) DIAGNOSTIC STUDIES: Oxygen Saturation is 100% on RA, normal by my interpretation.   COORDINATION OF CARE: 12:01 PM- Will order medication for pain. Pt verbalizes understanding and agrees to plan.  Medications  cyclobenzaprine (FLEXERIL) tablet 10 mg (10 mg Oral Given 01/25/14 1211)    MDM  38 y.o. morbidly obese female with low back pain. Stable for discharge without neuro deficits. Discussed with the patient and all questioned fully answered. She will return if any problems arise.    Medication List    TAKE these medications       cyclobenzaprine 10 MG tablet  Commonly known as:  FLEXERIL  Take 1 tablet (10 mg total) by mouth 2 (two) times daily as needed for muscle spasms.      ASK your doctor about these medications       acetaminophen 500 MG tablet  Commonly known as:  TYLENOL  Take 1,000 mg by mouth every 4 (four) hours as needed for moderate pain.        I personally performed the services described in this documentation, which was scribed in my presence. The recorded information has been reviewed and is accurate.    [address]Loyalty Brashier [place] Camerin Jimenez, [place] 01/26/14 [phone]

## 2014-01-26 NOTE — ED Provider Notes (Signed)
Medical screening examination/treatment/procedure(s) were performed by non-physician practitioner and as supervising physician I was immediately available for consultation/collaboration.   EKG Interpretation None        Joya Gaskinsonald W Dagoberto Nealy, MD 01/26/14 1240

## 2014-09-19 ENCOUNTER — Encounter (HOSPITAL_COMMUNITY): Payer: Self-pay | Admitting: Emergency Medicine

## 2014-09-19 ENCOUNTER — Emergency Department (HOSPITAL_COMMUNITY)
Admission: EM | Admit: 2014-09-19 | Discharge: 2014-09-19 | Disposition: A | Discharge status: Home / Self Care | Payer: PRIVATE HEALTH INSURANCE | Attending: Emergency Medicine | Admitting: Emergency Medicine

## 2014-09-19 DIAGNOSIS — Z79899 Other long term (current) drug therapy: Secondary | ICD-10-CM | POA: Diagnosis not present

## 2014-09-19 DIAGNOSIS — I10 Essential (primary) hypertension: Secondary | ICD-10-CM | POA: Insufficient documentation

## 2014-09-19 DIAGNOSIS — M542 Cervicalgia: Secondary | ICD-10-CM | POA: Insufficient documentation

## 2014-09-19 DIAGNOSIS — Z8669 Personal history of other diseases of the nervous system and sense organs: Secondary | ICD-10-CM | POA: Diagnosis not present

## 2014-09-19 DIAGNOSIS — Z872 Personal history of diseases of the skin and subcutaneous tissue: Secondary | ICD-10-CM | POA: Diagnosis not present

## 2014-09-19 MED ORDER — TRAMADOL HCL 50 MG PO TABS: 50.0000 mg | ORAL_TABLET | Freq: Four times a day (QID) | ORAL | Status: DC | PRN

## 2014-09-19 NOTE — ED Provider Notes (Signed)
CSN: [id number]     Arrival date & time 09/19/14  0819 History  This chart was scribed for Doug Sou, MD by Tonye Royalty, ED Scribe. This patient was seen in room APA06/APA06 and the patient's care was started at 8:30 AM.    Chief Complaint  Patient presents with  . Neck Pain   The history is provided by the patient. No language interpreter was used.    HPI Comments: Marissa Simpson is a 39 y.o. female who presents to the Emergency Department complaining of right-sided neck pain with onset 3 days ago upon waking, radiating up to behind her rightear and down to her right proximal shoulder. She states she initially believed it was a crick that would resolve, but has progressively worsened. She states pain is worse with movement of her head rated at 10/10, but states it is still present when sitting at rates it at 8/10. She states she has used Tramadol and Flexeril, prescribed for her back, with some improvement. She notes she had a nurse "pop" it and it improved for approximately 2 hours, but pain returned. She reports history of HTN for which she is not currently taking any medications. She denies other health problems. She denies smiking, drinking, or illegal drug use. 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 She denies incontinence, difficulty swallowing, decreased hearing, visual disturbance, or abdominal pain no focal numbness or weakness. No fever. No other associated symptoms.  PDP: Ignatius Specking., MD    Past Medical History  Diagnosis Date  . Hypertension   . Carpal tunnel syndrome   . History of stomach ulcers    Past Surgical History  Procedure Laterality Date  . Tubal ligation    . Carpal tunnel release     History reviewed. No pertinent family history. History  Substance Use Topics  . Smoking status: Never Smoker   . Smokeless tobacco: Not on file  . Alcohol Use: No   OB History    No data available     Review of Systems   Constitutional: Negative.   HENT: Negative.  Negative for hearing loss.   Eyes: Negative for visual disturbance.  Respiratory: Negative.   Cardiovascular: Negative.   Gastrointestinal: Negative.  Negative for abdominal pain.  Musculoskeletal: Positive for neck pain.  Skin: Negative.   Neurological: Negative.   Psychiatric/Behavioral: Negative.       Allergies  Aspirin; Darvocet; and Ibuprofen  Home Medications   Prior to Admission medications   Medication Sig Start Date End Date Taking? Authorizing Provider  acetaminophen (TYLENOL) 500 MG tablet Take 1,000 mg by mouth every 4 (four) hours as needed for moderate pain.    Historical Provider, MD  cyclobenzaprine (FLEXERIL) 10 MG tablet Take 1 tablet (10 mg total) by mouth 2 (two) times daily as needed for muscle spasms. 01/25/14   Hope Orlene Och, NP   Ht  (1.6 m)  Wt 233 lb (105.688 kg)  BMI 41.28 kg/m2  LMP 09/18/2014 Physical Exam  Constitutional: She appears well-developed and well-nourished.  HENT:  Head: Normocephalic and atraumatic.  Eyes: Conjunctivae are normal. Pupils are equal, round, and reactive to light.  Neck: Neck supple. No JVD present. No tracheal deviation present. No thyromegaly present.  No bruit  Cardiovascular: Normal rate and regular rhythm.   No murmur heard. Pulmonary/Chest: Effort normal and breath sounds normal.  Abdominal: Soft. Bowel sounds are normal. She exhibits no distension. There is no tenderness.  Musculoskeletal: Normal  range of motion. She exhibits no edema or tenderness.  No tenderness along spine. She has pain at right trapezius muscle upon active motion of her neck. No redness no swelling  Lymphadenopathy:    She has no cervical adenopathy.  Neurological: She is alert. She has normal reflexes. No cranial nerve deficit. Coordination normal.  Motor strength 5 over 5 overall. DTRs symmetric bilaterally knee jerk ankle jerk biceps toes to order bilaterally  Skin: Skin is warm and  dry. No rash noted.  Psychiatric: She has a normal mood and affect.  Nursing note and vitals reviewed.   ED Course  Procedures (including critical care time)  DIAGNOSTIC STUDIES: Oxygen Saturation is 100% on room air, normal by my interpretation.    COORDINATION OF CARE: 8:35 AM Discussed treatment plan with patient at beside, the patient agrees with the plan and has no further questions at this time.   Labs Review Labs Reviewed - No data to display  Imaging Review No results found.   EKG Interpretation None     Patient offered soft cervical collar which she declined MDM  Patient using tramadol 50 Mill grams twice a day. Her dose be increased to maximum 100 mg 4 times daily. I will write a prescription. She can follow up with Dr.Vyaz if not improved in 2 or 3 days Final diagnoses:  None   pain felt to be muscular in etiology. Imaging not indicated  Diagnosis neck pain      Doug Sou, MD 09/19/14 [phone]

## 2014-09-19 NOTE — Discharge Instructions (Signed)
Cervical Sprain You may increase your tramadol doses to 4 times daily. Hold a warm compress over your neck 4 times daily 30 minutes at a time. You can continue to use your Flexeril as directed See your primary care physician if not improving in 2 or 3 days. A cervical sprain is an injury in the neck in which the strong, fibrous tissues (ligaments) that connect your neck bones stretch or tear. Cervical sprains can range from mild to severe. Severe cervical sprains can cause the neck vertebrae to be unstable. This can lead to damage of the spinal cord and can result in serious nervous system problems. The amount of time it takes for a cervical sprain to get better depends on the cause and extent of the injury. Most cervical sprains heal in 1 to 3 weeks. CAUSES  Severe cervical sprains may be caused by:   Contact sport injuries (such as from football, rugby, wrestling, hockey, auto racing, gymnastics, diving, martial arts, or boxing).   Motor vehicle collisions.   Whiplash injuries. This is an injury from a sudden forward and backward whipping movement of the head and neck.  Falls.  Mild cervical sprains may be caused by:   Being in an awkward position, such as while cradling a telephone between your ear and shoulder.   Sitting in a chair that does not offer proper support.   Working at a poorly Marketing executive station.   Looking up or down for long periods of time.  SYMPTOMS   Pain, soreness, stiffness, or a burning sensation in the front, back, or sides of the neck. This discomfort may develop immediately after the injury or slowly, 24 hours or more after the injury.   Pain or tenderness directly in the middle of the back of the neck.   Shoulder or upper back pain.   Limited ability to move the neck.   Headache.   Dizziness.   Weakness, numbness, or tingling in the hands or arms.   Muscle spasms.   Difficulty swallowing or chewing.   Tenderness and swelling  of the neck.  DIAGNOSIS  Most of the time your health care provider can diagnose a cervical sprain by taking your history and doing a physical exam. Your health care provider will ask about previous neck injuries and any known neck problems, such as arthritis in the neck. X-rays may be taken to find out if there are any other problems, such as with the bones of the neck. Other tests, such as a CT scan or MRI, may also be needed.  TREATMENT  Treatment depends on the severity of the cervical sprain. Mild sprains can be treated with rest, keeping the neck in place (immobilization), and pain medicines. Severe cervical sprains are immediately immobilized. Further treatment is done to help with pain, muscle spasms, and other symptoms and may include:  Medicines, such as pain relievers, numbing medicines, or muscle relaxants.   Physical therapy. This may involve stretching exercises, strengthening exercises, and posture training. Exercises and improved posture can help stabilize the neck, strengthen muscles, and help stop symptoms from returning.  HOME CARE INSTRUCTIONS   Put ice on the injured area.   Put ice in a plastic bag.   Place a towel between your skin and the bag.   Leave the ice on for 15-20 minutes, 3-4 times a day.   If your injury was severe, you may have been given a cervical collar to wear. A cervical collar is a two-piece collar designed to  keep your neck from moving while it heals.  Do not remove the collar unless instructed by your health care provider.  If you have long hair, keep it outside of the collar.  Ask your health care provider before making any adjustments to your collar. Minor adjustments may be required over time to improve comfort and reduce pressure on your chin or on the back of your head.  Ifyou are allowed to remove the collar for cleaning or bathing, follow your health care provider's instructions on how to do so safely.  Keep your collar clean by  wiping it with mild soap and water and drying it completely. If the collar you have been given includes removable pads, remove them every 1-2 days and hand wash them with soap and water. Allow them to air dry. They should be completely dry before you wear them in the collar.  If you are allowed to remove the collar for cleaning and bathing, wash and dry the skin of your neck. Check your skin for irritation or sores. If you see any, tell your health care provider.  Do not drive while wearing the collar.   Only take over-the-counter or prescription medicines for pain, discomfort, or fever as directed by your health care provider.   Keep all follow-up appointments as directed by your health care provider.   Keep all physical therapy appointments as directed by your health care provider.   Make any needed adjustments to your workstation to promote good posture.   Avoid positions and activities that make your symptoms worse.   Warm up and stretch before being active to help prevent problems.  SEEK MEDICAL CARE IF:   Your pain is not controlled with medicine.   You are unable to decrease your pain medicine over time as planned.   Your activity level is not improving as expected.  SEEK IMMEDIATE MEDICAL CARE IF:   You develop any bleeding.  You develop stomach upset.  You have signs of an allergic reaction to your medicine.   Your symptoms get worse.   You develop new, unexplained symptoms.   You have numbness, tingling, weakness, or paralysis in any part of your body.  MAKE SURE YOU:   Understand these instructions.  Will watch your condition.  Will get help right away if you are not doing well or get worse. Document Released: 07/01/2007 Document Revised: 09/08/2013 Document Reviewed: 03/11/2013 [facility] Patient Information 2015 [place], [place]. This information is not intended to replace advice given to you by your health care provider. Make sure you discuss any  questions you have with your health care provider.

## 2014-09-19 NOTE — ED Notes (Signed)
Pt placed in soft collar.

## 2014-09-19 NOTE — ED Notes (Signed)
MD at bedside. 

## 2014-09-19 NOTE — ED Notes (Signed)
Pt reports right sided neck pain since Thursday. Pt reports pain increased with movement. Pt denies any recent injury or fall. nad noted. Airway patent.

## 2015-02-06 ENCOUNTER — Encounter (HOSPITAL_COMMUNITY): Payer: Self-pay

## 2015-02-06 ENCOUNTER — Emergency Department (HOSPITAL_COMMUNITY)
Admission: EM | Admit: 2015-02-06 | Discharge: 2015-02-06 | Disposition: A | Discharge status: Home / Self Care | Payer: PRIVATE HEALTH INSURANCE | Attending: Emergency Medicine | Admitting: Emergency Medicine

## 2015-02-06 DIAGNOSIS — Z3202 Encounter for pregnancy test, result negative: Secondary | ICD-10-CM | POA: Insufficient documentation

## 2015-02-06 DIAGNOSIS — N939 Abnormal uterine and vaginal bleeding, unspecified: Secondary | ICD-10-CM | POA: Diagnosis not present

## 2015-02-06 DIAGNOSIS — Z87442 Personal history of urinary calculi: Secondary | ICD-10-CM | POA: Diagnosis not present

## 2015-02-06 DIAGNOSIS — I10 Essential (primary) hypertension: Secondary | ICD-10-CM | POA: Insufficient documentation

## 2015-02-06 DIAGNOSIS — Z8669 Personal history of other diseases of the nervous system and sense organs: Secondary | ICD-10-CM | POA: Insufficient documentation

## 2015-02-06 DIAGNOSIS — Z79899 Other long term (current) drug therapy: Secondary | ICD-10-CM | POA: Insufficient documentation

## 2015-02-06 DIAGNOSIS — Z8719 Personal history of other diseases of the digestive system: Secondary | ICD-10-CM | POA: Insufficient documentation

## 2015-02-06 DIAGNOSIS — R319 Hematuria, unspecified: Secondary | ICD-10-CM | POA: Diagnosis present

## 2015-02-06 HISTORY — DX: Calculus of kidney: N20.0

## 2015-02-06 LAB — BASIC METABOLIC PANEL
ANION GAP: 4 — AB (ref 5–15)
BUN: 13 mg/dL (ref 6–20)
CHLORIDE: 107 mmol/L (ref 101–111)
CO2: 26 mmol/L (ref 22–32)
Calcium: 8.7 mg/dL — ABNORMAL LOW (ref 8.9–10.3)
Creatinine, Ser: 0.8 mg/dL (ref 0.44–1.00)
GFR calc Af Amer: >60 mL/min (ref >60)
GFR calc non Af Amer: >60 mL/min (ref >60)
GLUCOSE: 83 mg/dL (ref 65–99)
Potassium: 4.2 mmol/L (ref 3.5–5.1)
Sodium: 137 mmol/L (ref 135–145)

## 2015-02-06 LAB — URINALYSIS, ROUTINE W REFLEX MICROSCOPIC
BILIRUBIN URINE: NEGATIVE
Bilirubin Urine: NEGATIVE
Glucose, UA: NEGATIVE mg/dL
Glucose, UA: NEGATIVE mg/dL
Hgb urine dipstick: NEGATIVE
Ketones, ur: NEGATIVE mg/dL
Ketones, ur: NEGATIVE mg/dL
LEUKOCYTES UA: NEGATIVE
Leukocytes, UA: NEGATIVE
Nitrite: NEGATIVE
Nitrite: NEGATIVE
PROTEIN: NEGATIVE mg/dL
Protein, ur: NEGATIVE mg/dL
SPECIFIC GRAVITY, URINE: 1.015 (ref 1.005–1.030)
Specific Gravity, Urine: <1.005 — ABNORMAL LOW (ref 1.005–1.030)
UROBILINOGEN UA: 0.2 mg/dL (ref 0.0–1.0)
UROBILINOGEN UA: 0.2 mg/dL (ref 0.0–1.0)
pH: 5.5 (ref 5.0–8.0)
pH: 6 (ref 5.0–8.0)

## 2015-02-06 LAB — CBC WITH DIFFERENTIAL/PLATELET
BASOS PCT: 0 % (ref 0–1)
Basophils Absolute: 0 10*3/uL (ref 0.0–0.1)
EOS PCT: 1 % (ref 0–5)
Eosinophils Absolute: 0.1 10*3/uL (ref 0.0–0.7)
HEMATOCRIT: 32.5 % — AB (ref 36.0–46.0)
HEMOGLOBIN: 10.1 g/dL — AB (ref 12.0–15.0)
Lymphocytes Relative: 31 % (ref 12–46)
Lymphs Abs: 3.4 10*3/uL (ref 0.7–4.0)
MCH: 24.2 pg — AB (ref 26.0–34.0)
MCHC: 31.1 g/dL (ref 30.0–36.0)
MCV: 77.9 fL — AB (ref 78.0–100.0)
Monocytes Absolute: 0.7 10*3/uL (ref 0.1–1.0)
Monocytes Relative: 7 % (ref 3–12)
Neutro Abs: 6.8 10*3/uL (ref 1.7–7.7)
Neutrophils Relative %: 61 % (ref 43–77)
PLATELETS: 339 10*3/uL (ref 150–400)
RBC: 4.17 MIL/uL (ref 3.87–5.11)
RDW: 15 % (ref 11.5–15.5)
WBC: 11 10*3/uL — ABNORMAL HIGH (ref 4.0–10.5)

## 2015-02-06 LAB — PREGNANCY, URINE: PREG TEST UR: NEGATIVE

## 2015-02-06 LAB — URINE MICROSCOPIC-ADD ON

## 2015-02-06 LAB — WET PREP, GENITAL
CLUE CELLS WET PREP: NONE SEEN
Trich, Wet Prep: NONE SEEN
YEAST WET PREP: NONE SEEN

## 2015-02-06 NOTE — Discharge Instructions (Signed)
Your cath urine today is negative for blood. You do have vaginal bleeding. This may be an early period. Your pregnancy test is negative. Follow up with your doctor or return here as needed.

## 2015-02-06 NOTE — ED Notes (Signed)
Pt stated that she was wearing a tampon when urine specimen was collected. No visible blood seen with specimen. Clear and yellow in color. Pt states tampon had a little blood on it, which was removed after urinating. Pt states recent hx of CT showing multiple bilateral kidney stones. Pt states a small amount of back pain this morning and that she took a Norco earlier. NAD.

## 2015-02-06 NOTE — ED Notes (Signed)
Passing clots with last weekend. Went to Dr. And CT ordered diagnosed with kidney stones. Reports 3 stones in each kidney. Bleeding and clots started on Friday. Unable to determine if clots are coming from cycle or stones

## 2015-02-06 NOTE — ED Provider Notes (Signed)
CSN: [id number]     Arrival date & time 02/06/15  4098 History   First MD Initiated Contact with Patient 02/06/15 [phone]     Chief Complaint  Patient presents with  . Hematuria     (Consider location/radiation/quality/duration/timing/severity/associated sxs/prior Treatment) Patient is a 39 y.o. female presenting with hematuria. The history is provided by the patient. No language interpreter was used.  Hematuria This is a new problem. The current episode started in the past 7 days. The problem has been unchanged. Nothing aggravates the symptoms. She has tried nothing for the symptoms.   Marissa Simpson is a 39 y.o. female who presents to the ED with "passing blood clots", she is currently having her menses and is not sure if blood clots are vaginal or ureteral. She had a CT scan last week that showed 3 stones in each kidney. She denies abdominal or flank pain, no n/v/d. No other symptoms. She states that is is not quite time for her period and she has had a BTL so someone told her she needed to come and make sure she did not have an ectopic pregnancy.   Past Medical History  Diagnosis Date  . Hypertension   . Carpal tunnel syndrome   . History of stomach ulcers   . Kidney stones kidney stones  . Kidney stones kidney stones   Past Surgical History  Procedure Laterality Date  . Tubal ligation    . Carpal tunnel release     No family history on file. History  Substance Use Topics  . Smoking status: Never Smoker   . Smokeless tobacco: Not on file  . Alcohol Use: No   OB History    Gravida Para Term Preterm AB TAB SAB Ectopic Multiple Living   3 3             Review of Systems  Genitourinary: Positive for vaginal bleeding. Hematuria: ?   All other systems negative   Allergies  Aspirin; Darvocet; and Ibuprofen  Home Medications   Prior to Admission medications   Medication Sig Start Date End Date Taking? Authorizing Provider  acetaminophen (TYLENOL) 500 MG tablet Take  1,000 mg by mouth every 4 (four) hours as needed for moderate pain.   Yes Historical Provider, MD  clonazePAM (KLONOPIN) 0.5 MG tablet Take 0.25 mg by mouth daily as needed for anxiety.   Yes Historical Provider, MD  cyclobenzaprine (FLEXERIL) 10 MG tablet Take 1 tablet (10 mg total) by mouth 2 (two) times daily as needed for muscle spasms. Patient taking differently: Take 10 mg by mouth at bedtime as needed for muscle spasms.  01/25/14  Yes Hope Orlene Och, NP  HYDROcodone-acetaminophen (NORCO/VICODIN) 5-325 MG per tablet Take 1 tablet by mouth every 6 (six) hours as needed for moderate pain.   Yes Historical Provider, MD  traMADol (ULTRAM) 50 MG tablet Take 1 tablet (50 mg total) by mouth every 6 (six) hours as needed. Patient taking differently: Take 50 mg by mouth every 6 (six) hours as needed for moderate pain.  09/19/14  Yes Sam Ethelda Chick, MD   BP 148/89 mmHg  Pulse 96  Temp(Src) 98.8 F (37.1 C) (Oral)  Resp 16  Ht  (1.6 m)  Wt 242 lb (109.77 kg)  BMI 42.88 kg/m2  SpO2 100%  LMP 01/10/2015 Physical Exam  Constitutional: She is oriented to person, place, and time. She appears well-developed and well-nourished.  HENT:  Head: Normocephalic.  Eyes: EOM are normal.  Neck: Normal range of  motion. Neck supple.  Cardiovascular: Normal rate.   Pulmonary/Chest: Effort normal.  Abdominal: Soft. There is no tenderness. There is no CVA tenderness.  Genitourinary:  External genitalia without lesions. Small blood vaginal vault. No CMT, no adnexal tenderness, uterus without palpable enlargement.   Musculoskeletal: Normal range of motion.  Neurological: She is alert and oriented to person, place, and time. No cranial nerve deficit.  Skin: Skin is warm and dry.  Psychiatric: She has a normal mood and affect. Her behavior is normal.  Nursing note and vitals reviewed.   ED Course  Procedures (including critical care time) Labs Review Results for orders placed or performed during the  hospital encounter of 02/06/15 (from the past 24 hour(s))  Urinalysis, Routine w reflex microscopic     Status: Abnormal   Collection Time: 02/06/15 10:00 AM  Result Value Ref Range   Color, Urine YELLOW YELLOW   APPearance CLEAR CLEAR   Specific Gravity, Urine <1.005 (L) 1.005 - 1.030   pH 5.5 5.0 - 8.0   Glucose, UA NEGATIVE NEGATIVE mg/dL   Hgb urine dipstick LARGE (A) NEGATIVE   Bilirubin Urine NEGATIVE NEGATIVE   Ketones, ur NEGATIVE NEGATIVE mg/dL   Protein, ur NEGATIVE NEGATIVE mg/dL   Urobilinogen, UA 0.2 0.0 - 1.0 mg/dL   Nitrite NEGATIVE NEGATIVE   Leukocytes, UA NEGATIVE NEGATIVE  Urine microscopic-add on     Status: Abnormal   Collection Time: 02/06/15 10:00 AM  Result Value Ref Range   Squamous Epithelial / LPF FEW (A) RARE   WBC, UA 0-2 <3 WBC/hpf   RBC / HPF 11-20 <3 RBC/hpf   Bacteria, UA RARE RARE  Basic metabolic panel     Status: Abnormal   Collection Time: 02/06/15 11:00 AM  Result Value Ref Range   Sodium 137 135 - 145 mmol/L   Potassium 4.2 3.5 - 5.1 mmol/L   Chloride 107 101 - 111 mmol/L   CO2 26 22 - 32 mmol/L   Glucose, Bld 83 65 - 99 mg/dL   BUN 13 6 - 20 mg/dL   Creatinine, Ser 9.140.80 0.44 - 1.00 mg/dL   Calcium 8.7 (L) 8.9 - 10.3 mg/dL   GFR calc non Af Amer >60 >60 mL/min   GFR calc Af Amer >60 >60 mL/min   Anion gap 4 (L) 5 - 15  CBC with Differential     Status: Abnormal   Collection Time: 02/06/15 11:00 AM  Result Value Ref Range   WBC 11.0 (H) 4.0 - 10.5 K/uL   RBC 4.17 3.87 - 5.11 MIL/uL   Hemoglobin 10.1 (L) 12.0 - 15.0 g/dL   HCT 78.232.5 (L) 95.636.0 - 21.346.0 %   MCV 77.9 (L) 78.0 - 100.0 fL   MCH 24.2 (L) 26.0 - 34.0 pg   MCHC 31.1 30.0 - 36.0 g/dL   RDW 08.615.0 57.811.5 - 46.915.5 %   Platelets 339 150 - 400 K/uL   Neutrophils Relative % 61 43 - 77 %   Neutro Abs 6.8 1.7 - 7.7 K/uL   Lymphocytes Relative 31 12 - 46 %   Lymphs Abs 3.4 0.7 - 4.0 K/uL   Monocytes Relative 7 3 - 12 %   Monocytes Absolute 0.7 0.1 - 1.0 K/uL   Eosinophils Relative 1 0  - 5 %   Eosinophils Absolute 0.1 0.0 - 0.7 K/uL   Basophils Relative 0 0 - 1 %   Basophils Absolute 0.0 0.0 - 0.1 K/uL  Wet prep, genital     Status: Abnormal  Collection Time: 02/06/15 11:13 AM  Result Value Ref Range   Yeast Wet Prep HPF POC NONE SEEN NONE SEEN   Trich, Wet Prep NONE SEEN NONE SEEN   Clue Cells Wet Prep HPF POC NONE SEEN NONE SEEN   WBC, Wet Prep HPF POC FEW (A) NONE SEEN  Pregnancy, urine     Status: None   Collection Time: 02/06/15 11:25 AM  Result Value Ref Range   Preg Test, Ur NEGATIVE NEGATIVE  Urinalysis, Routine w reflex microscopic     Status: None   Collection Time: 02/06/15 11:25 AM  Result Value Ref Range   Color, Urine YELLOW YELLOW   APPearance CLEAR CLEAR   Specific Gravity, Urine 1.015 1.005 - 1.030   pH 6.0 5.0 - 8.0   Glucose, UA NEGATIVE NEGATIVE mg/dL   Hgb urine dipstick NEGATIVE NEGATIVE   Bilirubin Urine NEGATIVE NEGATIVE   Ketones, ur NEGATIVE NEGATIVE mg/dL   Protein, ur NEGATIVE NEGATIVE mg/dL   Urobilinogen, UA 0.2 0.0 - 1.0 mg/dL   Nitrite NEGATIVE NEGATIVE   Leukocytes, UA NEGATIVE NEGATIVE    Note, initial urine, clean catch positive for blood, cath urine negative for blood. Patient without n/v, no abdominal pain, no flank pain.   MDM  39 y.o. female with vaginal bleeding and negative blood on cath urine. Stable for d/c without signs of renal colic. She will follow up with her GYN or return as needed for problems. Discussed with the patient clinical and lab findings and plan of care. All questioned fully answered.   Final diagnoses:  Vaginal bleeding       [name], NP 02/06/15 1226  Vanetta Mulders, MD 02/08/15 [phone]

## 2015-02-07 LAB — GC/CHLAMYDIA PROBE AMP (~~LOC~~) NOT AT ARMC
CHLAMYDIA, DNA PROBE: NEGATIVE
Neisseria Gonorrhea: NEGATIVE

## 2015-02-08 LAB — URINE CULTURE
Colony Count: NO GROWTH
Culture: NO GROWTH

## 2016-03-16 ENCOUNTER — Encounter (HOSPITAL_COMMUNITY): Payer: Self-pay | Admitting: Emergency Medicine

## 2016-03-16 ENCOUNTER — Emergency Department (HOSPITAL_COMMUNITY)
Admission: EM | Admit: 2016-03-16 | Discharge: 2016-03-16 | Disposition: A | Discharge status: Home / Self Care | Payer: PRIVATE HEALTH INSURANCE | Attending: Emergency Medicine | Admitting: Emergency Medicine

## 2016-03-16 ENCOUNTER — Emergency Department (HOSPITAL_COMMUNITY): Payer: PRIVATE HEALTH INSURANCE

## 2016-03-16 DIAGNOSIS — I10 Essential (primary) hypertension: Secondary | ICD-10-CM | POA: Diagnosis not present

## 2016-03-16 DIAGNOSIS — R1011 Right upper quadrant pain: Secondary | ICD-10-CM | POA: Diagnosis not present

## 2016-03-16 DIAGNOSIS — R1013 Epigastric pain: Secondary | ICD-10-CM | POA: Diagnosis present

## 2016-03-16 DIAGNOSIS — Z79899 Other long term (current) drug therapy: Secondary | ICD-10-CM | POA: Diagnosis not present

## 2016-03-16 LAB — COMPREHENSIVE METABOLIC PANEL
ALT: 13 U/L — AB (ref 14–54)
AST: 18 U/L (ref 15–41)
Albumin: 3.7 g/dL (ref 3.5–5.0)
Alkaline Phosphatase: 64 U/L (ref 38–126)
Anion gap: 6 (ref 5–15)
BUN: 11 mg/dL (ref 6–20)
CHLORIDE: 100 mmol/L — AB (ref 101–111)
CO2: 31 mmol/L (ref 22–32)
CREATININE: 0.95 mg/dL (ref 0.44–1.00)
Calcium: 9.7 mg/dL (ref 8.9–10.3)
GFR calc non Af Amer: >60 mL/min (ref >60)
Glucose, Bld: 80 mg/dL (ref 65–99)
Potassium: 4 mmol/L (ref 3.5–5.1)
SODIUM: 137 mmol/L (ref 135–145)
Total Bilirubin: 0.4 mg/dL (ref 0.3–1.2)
Total Protein: 7.8 g/dL (ref 6.5–8.1)

## 2016-03-16 LAB — CBC WITH DIFFERENTIAL/PLATELET
Basophils Absolute: 0 10*3/uL (ref 0.0–0.1)
Basophils Relative: 0 %
EOS ABS: 0.1 10*3/uL (ref 0.0–0.7)
Eosinophils Relative: 2 %
HEMATOCRIT: 42.5 % (ref 36.0–46.0)
HEMOGLOBIN: 13.8 g/dL (ref 12.0–15.0)
LYMPHS ABS: 2.2 10*3/uL (ref 0.7–4.0)
LYMPHS PCT: 28 %
MCH: 27.1 pg (ref 26.0–34.0)
MCHC: 32.5 g/dL (ref 30.0–36.0)
MCV: 83.3 fL (ref 78.0–100.0)
MONOS PCT: 6 %
Monocytes Absolute: 0.5 10*3/uL (ref 0.1–1.0)
NEUTROS ABS: 5.1 10*3/uL (ref 1.7–7.7)
NEUTROS PCT: 64 %
Platelets: 344 10*3/uL (ref 150–400)
RBC: 5.1 MIL/uL (ref 3.87–5.11)
RDW: 13.8 % (ref 11.5–15.5)
WBC: 7.9 10*3/uL (ref 4.0–10.5)

## 2016-03-16 LAB — LIPASE, BLOOD: Lipase: 14 U/L (ref 11–51)

## 2016-03-16 MED ORDER — TRAMADOL HCL 50 MG PO TABS: 50.0000 mg | ORAL_TABLET | Freq: Four times a day (QID) | ORAL | Status: DC | PRN

## 2016-03-16 MED ORDER — CYCLOBENZAPRINE HCL 10 MG PO TABS: 10.0000 mg | ORAL_TABLET | Freq: Every evening | ORAL | Status: AC | PRN

## 2016-03-16 MED ORDER — GI COCKTAIL ~~LOC~~
30.0000 mL | Freq: Once | ORAL | Status: AC
  Administered 2016-03-16: 30 mL via ORAL
  Filled 2016-03-16: qty 30

## 2016-03-16 MED ORDER — OMEPRAZOLE 20 MG PO CPDR: 20.0000 mg | DELAYED_RELEASE_CAPSULE | Freq: Every day | ORAL | Status: DC

## 2016-03-16 NOTE — ED Provider Notes (Signed)
CSN: [id number]     Arrival date & time 03/16/16  1017 History  By signing my name below, I, Jasmyn B. Alexander, attest that this documentation has been prepared under the direction and in the presence of Marily MemosJason Amal Renbarger, MD.  Electronically Signed: Gillis EndsJasmyn B. Lyn HollingsheadAlexander, ED Scribe. 03/16/2016. 12:05 PM.    Chief Complaint  Patient presents with  . gastric pain     right    The history is provided by the patient. No language interpreter was used.    HPI Comments: [name] is a 40 y.o. female with PMHx of HTN, Stomach ulcers, and kidney stones who presents to the Emergency Department complaining of gradual onset, constant, 10/10, radiating right gastric pain to middle of her back x 2 days. Pt has associated nausea. Last bowel movement was on 03/14/16. She has taken Tums and Alka Event organisereltzer Gas & Heartburn with no relief.  Pain is exacerbated when applying pressure and after eating. She denies any pertinent GI history. Denies vomiting, SOB, diaphoresis, dysuria, unexpected weight change, diarrhea, or constipation.   Past Medical History  Diagnosis Date  . Hypertension   . Carpal tunnel syndrome   . History of stomach ulcers   . Kidney stones kidney stones  . Kidney stones kidney stones   Past Surgical History  Procedure Laterality Date  . Tubal ligation    . Carpal tunnel release     History reviewed. No pertinent family history. Social History  Substance Use Topics  . Smoking status: Never Smoker   . Smokeless tobacco: None  . Alcohol Use: No   OB History    Gravida Para Term Preterm AB TAB SAB Ectopic Multiple Living   3 3             Review of Systems  Constitutional: Negative for diaphoresis and unexpected weight change.  Respiratory: Negative for shortness of breath.   Gastrointestinal: Positive for nausea and abdominal pain. Negative for vomiting, diarrhea and constipation.  Genitourinary: Negative for dysuria.  Musculoskeletal: Positive for back pain.  All other  systems reviewed and are negative.   Allergies  Aspirin; Darvocet; and Ibuprofen  Home Medications   Prior to Admission medications   Medication Sig Start Date End Date Taking? Authorizing Provider  acetaminophen (TYLENOL) 500 MG tablet Take 1,000 mg by mouth every 4 (four) hours as needed for moderate pain.   Yes Historical Provider, MD  busPIRone (BUSPAR) 15 MG tablet Take 15 mg by mouth 3 (three) times daily.   Yes Historical Provider, MD  calcium carbonate (TUMS - DOSED IN MG ELEMENTAL CALCIUM) 500 MG chewable tablet Chew 1 tablet by mouth daily as needed for indigestion or heartburn.   Yes Historical Provider, MD  Calcium Carbonate-Simethicone (ALKA-SELTZER HEARTBURN + GAS) 750-80 MG CHEW Chew 1 tablet by mouth daily as needed (heartburn).   Yes Historical Provider, MD  clonazePAM (KLONOPIN) 0.5 MG tablet Take 0.25 mg by mouth daily as needed for anxiety.   Yes Historical Provider, MD  cyclobenzaprine (FLEXERIL) 10 MG tablet Take 1 tablet (10 mg total) by mouth 2 (two) times daily as needed for muscle spasms. Patient taking differently: Take 10 mg by mouth at bedtime as needed for muscle spasms.  01/25/14  Yes Hope Orlene OchM Neese, NP  PARoxetine (PAXIL) 10 MG tablet Take 10 mg by mouth at bedtime.   Yes Historical Provider, MD  traMADol (ULTRAM) 50 MG tablet Take 1 tablet (50 mg total) by mouth every 6 (six) hours as needed. Patient taking  differently: Take 50 mg by mouth every 6 (six) hours as needed for moderate pain.  09/19/14  Yes Sam Ethelda ChickJacubowitz, MD   BP 119/73 mmHg  Pulse 90  Temp(Src) 98.3 F (36.8 C) (Oral)  Resp 16  Ht 5\' 3"  (1.6 m)  Wt 256 lb (116.121 kg)  BMI 45.36 kg/m2  SpO2 100%  LMP 03/11/2016 Physical Exam  Constitutional: She is oriented to person, place, and time. She appears well-developed and well-nourished. No distress.  HENT:  Head: Normocephalic and atraumatic.  Eyes: Conjunctivae are normal.  Cardiovascular: Normal rate, regular rhythm and normal heart sounds.    Pulmonary/Chest: Effort normal and breath sounds normal.  Abdominal: Soft. She exhibits no distension. There is tenderness. There is no rebound.  No lower abdominal tenderness. Hyperactive bowel sounds. Epigastric tenderness.  Musculoskeletal: She exhibits no tenderness.  No back or CVA tenderness.  Neurological: She is alert and oriented to person, place, and time.  Skin: Skin is warm and dry.  Psychiatric: She has a normal mood and affect.  Nursing note and vitals reviewed.   ED Course  Procedures (including critical care time) DIAGNOSTIC STUDIES: Oxygen Saturation is 100% on RA, normal by my interpretation.    COORDINATION OF CARE: 11:22 AM-Discussed treatment plan which includes US of abdomen, CBC, CMP, Lipase, and GI cocktail with pt at bedside and pt agreed to plan.   Labs Review Labs Reviewed  CBC WITH DIFFERENTIAL/PLATELET  COMPREHENSIVE METABOLIC PANEL  LIPASE, BLOOD    Imaging Review No results found. I have personally reviewed and evaluated these images and lab results as part of my medical decision-making.   MDM   Final diagnoses:  RUQ abdominal pain    40 yo F w/ likely gastritis v PUD.  negative US and labs. - doubt pancreatitis Symptoms improved with gi cocktail.  Will fu w/pcp next weekf or further management.  New Prescriptions: Discharge Medication List as of 03/16/2016  1:22 PM    START taking these medications   Details  omeprazole (PRILOSEC) 20 MG capsule Take 1 capsule (20 mg total) by mouth daily., Starting 03/16/2016, Until Discontinued, Print         I have personally and contemperaneously reviewed labs and imaging and used in my decision making as above.   A medical screening exam was performed and I feel the patient has had an appropriate workup for their chief complaint at this time and likelihood of emergent condition existing is low and thus workup can continue on an outpatient basis.. Their vital signs are stable. They have been  counseled on decision, discharge, follow up and which symptoms necessitate immediate return to the emergency department.  They verbally stated understanding and agreement with plan and discharged in stable condition.   ,I personally performed the services described in this documentation, which was scribed in my presence. The recorded information has been reviewed and is accurate.  Marily MemosJason Carlethia Mesquita, MD 03/16/16 [phone]

## 2016-03-16 NOTE — ED Notes (Signed)
C/o right upper gastric pain since Wed, rates pain 10/10.  Has been treating with tums and OTC meds with not relief.

## 2016-03-16 NOTE — ED Notes (Signed)
Pt states her upper gi pain worsens after she eats. Denies any GI history.

## 2020-11-06 ENCOUNTER — Other Ambulatory Visit: Payer: Self-pay

## 2020-11-06 ENCOUNTER — Observation Stay (HOSPITAL_COMMUNITY)
Admission: EM | Admit: 2020-11-06 | Discharge: 2020-11-07 | Disposition: A | Discharge status: Home / Self Care | Payer: Commercial Managed Care - PPO | Attending: Family Medicine | Admitting: Family Medicine

## 2020-11-06 ENCOUNTER — Encounter (HOSPITAL_COMMUNITY): Payer: Self-pay | Admitting: *Deleted

## 2020-11-06 ENCOUNTER — Emergency Department (HOSPITAL_COMMUNITY): Payer: Commercial Managed Care - PPO

## 2020-11-06 DIAGNOSIS — Z8711 Personal history of peptic ulcer disease: Secondary | ICD-10-CM | POA: Diagnosis not present

## 2020-11-06 DIAGNOSIS — I10 Essential (primary) hypertension: Secondary | ICD-10-CM | POA: Diagnosis not present

## 2020-11-06 DIAGNOSIS — N200 Calculus of kidney: Secondary | ICD-10-CM

## 2020-11-06 DIAGNOSIS — Z79899 Other long term (current) drug therapy: Secondary | ICD-10-CM | POA: Insufficient documentation

## 2020-11-06 DIAGNOSIS — R200 Anesthesia of skin: Secondary | ICD-10-CM

## 2020-11-06 DIAGNOSIS — Z20822 Contact with and (suspected) exposure to covid-19: Secondary | ICD-10-CM | POA: Diagnosis not present

## 2020-11-06 DIAGNOSIS — G459 Transient cerebral ischemic attack, unspecified: Secondary | ICD-10-CM | POA: Diagnosis not present

## 2020-11-06 DIAGNOSIS — R202 Paresthesia of skin: Secondary | ICD-10-CM | POA: Diagnosis present

## 2020-11-06 DIAGNOSIS — E559 Vitamin D deficiency, unspecified: Secondary | ICD-10-CM | POA: Diagnosis present

## 2020-11-06 DIAGNOSIS — Y900 Blood alcohol level of less than 20 mg/100 ml: Secondary | ICD-10-CM | POA: Diagnosis not present

## 2020-11-06 DIAGNOSIS — R7303 Prediabetes: Secondary | ICD-10-CM | POA: Diagnosis present

## 2020-11-06 HISTORY — DX: Transient cerebral ischemic attack, unspecified: G45.9

## 2020-11-06 LAB — RAPID URINE DRUG SCREEN, HOSP PERFORMED
Amphetamines: NOT DETECTED
Barbiturates: NOT DETECTED
Benzodiazepines: NOT DETECTED
Cocaine: NOT DETECTED
Opiates: NOT DETECTED
Tetrahydrocannabinol: NOT DETECTED

## 2020-11-06 LAB — CBC
HCT: 42.1 % (ref 36.0–46.0)
Hemoglobin: 13 g/dL (ref 12.0–15.0)
MCH: 26.8 pg (ref 26.0–34.0)
MCHC: 30.9 g/dL (ref 30.0–36.0)
MCV: 86.8 fL (ref 80.0–100.0)
Platelets: 362 10*3/uL (ref 150–400)
RBC: 4.85 MIL/uL (ref 3.87–5.11)
RDW: 14.5 % (ref 11.5–15.5)
WBC: 10 10*3/uL (ref 4.0–10.5)
nRBC: 0 % (ref 0.0–0.2)

## 2020-11-06 LAB — COMPREHENSIVE METABOLIC PANEL
ALT: 17 U/L (ref 0–44)
AST: 21 U/L (ref 15–41)
Albumin: 3.8 g/dL (ref 3.5–5.0)
Alkaline Phosphatase: 77 U/L (ref 38–126)
Anion gap: 6 (ref 5–15)
BUN: 15 mg/dL (ref 6–20)
CO2: 28 mmol/L (ref 22–32)
Calcium: 9.5 mg/dL (ref 8.9–10.3)
Chloride: 105 mmol/L (ref 98–111)
Creatinine, Ser: 0.95 mg/dL (ref 0.44–1.00)
GFR, Estimated: >60 mL/min (ref >60)
Glucose, Bld: 88 mg/dL (ref 70–99)
Potassium: 3.6 mmol/L (ref 3.5–5.1)
Sodium: 139 mmol/L (ref 135–145)
Total Bilirubin: 0.4 mg/dL (ref 0.3–1.2)
Total Protein: 7.9 g/dL (ref 6.5–8.1)

## 2020-11-06 LAB — I-STAT CHEM 8, ED
BUN: 15 mg/dL (ref 6–20)
Calcium, Ion: 1.28 mmol/L (ref 1.15–1.40)
Chloride: 103 mmol/L (ref 98–111)
Creatinine, Ser: 1 mg/dL (ref 0.44–1.00)
Glucose, Bld: 84 mg/dL (ref 70–99)
HCT: 42 % (ref 36.0–46.0)
Hemoglobin: 14.3 g/dL (ref 12.0–15.0)
Potassium: 3.7 mmol/L (ref 3.5–5.1)
Sodium: 142 mmol/L (ref 135–145)
TCO2: 30 mmol/L (ref 22–32)

## 2020-11-06 LAB — PROTIME-INR
INR: 1.1 (ref 0.8–1.2)
Prothrombin Time: 13.7 seconds (ref 11.4–15.2)

## 2020-11-06 LAB — DIFFERENTIAL
Abs Immature Granulocytes: 0.03 10*3/uL (ref 0.00–0.07)
Basophils Absolute: 0 10*3/uL (ref 0.0–0.1)
Basophils Relative: 0 %
Eosinophils Absolute: 0.2 10*3/uL (ref 0.0–0.5)
Eosinophils Relative: 2 %
Immature Granulocytes: 0 %
Lymphocytes Relative: 27 %
Lymphs Abs: 2.8 10*3/uL (ref 0.7–4.0)
Monocytes Absolute: 0.7 10*3/uL (ref 0.1–1.0)
Monocytes Relative: 7 %
Neutro Abs: 6.4 10*3/uL (ref 1.7–7.7)
Neutrophils Relative %: 64 %

## 2020-11-06 LAB — TSH: TSH: 1.594 u[IU]/mL (ref 0.350–4.500)

## 2020-11-06 LAB — URINALYSIS, ROUTINE W REFLEX MICROSCOPIC
Bilirubin Urine: NEGATIVE
Glucose, UA: NEGATIVE mg/dL
Hgb urine dipstick: NEGATIVE
Ketones, ur: NEGATIVE mg/dL
Leukocytes,Ua: NEGATIVE
Nitrite: NEGATIVE
Protein, ur: 30 mg/dL — AB
Specific Gravity, Urine: 1.026 (ref 1.005–1.030)
pH: 6 (ref 5.0–8.0)

## 2020-11-06 LAB — VITAMIN B12: Vitamin B-12: 242 pg/mL (ref 180–914)

## 2020-11-06 LAB — RESP PANEL BY RT-PCR (FLU A&B, COVID) ARPGX2
Influenza A by PCR: NEGATIVE
Influenza B by PCR: NEGATIVE
SARS Coronavirus 2 by RT PCR: NEGATIVE

## 2020-11-06 LAB — HEMOGLOBIN A1C
Hgb A1c MFr Bld: 5.9 % — ABNORMAL HIGH (ref 4.8–5.6)
Mean Plasma Glucose: 122.63 mg/dL

## 2020-11-06 LAB — ETHANOL: Alcohol, Ethyl (B): <10 mg/dL (ref <10)

## 2020-11-06 LAB — HIV ANTIBODY (ROUTINE TESTING W REFLEX): HIV Screen 4th Generation wRfx: NONREACTIVE

## 2020-11-06 LAB — VITAMIN D 25 HYDROXY (VIT D DEFICIENCY, FRACTURES): Vit D, 25-Hydroxy: 13.94 ng/mL — ABNORMAL LOW (ref 30–100)

## 2020-11-06 LAB — CBG MONITORING, ED: Glucose-Capillary: 73 mg/dL (ref 70–99)

## 2020-11-06 LAB — APTT: aPTT: 33 seconds (ref 24–36)

## 2020-11-06 MED ORDER — ACETAMINOPHEN 160 MG/5ML PO SOLN: 650.0000 mg | ORAL | Status: DC | PRN

## 2020-11-06 MED ORDER — ASPIRIN EC 81 MG PO TBEC
81.0000 mg | DELAYED_RELEASE_TABLET | Freq: Once | ORAL | Status: DC
  Filled 2020-11-06: qty 1

## 2020-11-06 MED ORDER — ASPIRIN 300 MG RE SUPP: 300.0000 mg | Freq: Once | RECTAL | Status: DC

## 2020-11-06 MED ORDER — STROKE: EARLY STAGES OF RECOVERY BOOK
Freq: Once | Status: AC
  Filled 2020-11-06: qty 1

## 2020-11-06 MED ORDER — PANTOPRAZOLE SODIUM 40 MG PO TBEC
40.0000 mg | DELAYED_RELEASE_TABLET | Freq: Every day | ORAL | Status: DC
  Administered 2020-11-07: 40 mg via ORAL
  Filled 2020-11-06: qty 1

## 2020-11-06 MED ORDER — ACETAMINOPHEN 650 MG RE SUPP: 650.0000 mg | RECTAL | Status: DC | PRN

## 2020-11-06 MED ORDER — SENNOSIDES-DOCUSATE SODIUM 8.6-50 MG PO TABS: 1.0000 | ORAL_TABLET | Freq: Every evening | ORAL | Status: DC | PRN

## 2020-11-06 MED ORDER — ASPIRIN 325 MG PO TABS
325.0000 mg | ORAL_TABLET | Freq: Every day | ORAL | Status: DC
  Administered 2020-11-07: 325 mg via ORAL
  Filled 2020-11-06: qty 1

## 2020-11-06 MED ORDER — TRAMADOL HCL 50 MG PO TABS: 50.0000 mg | ORAL_TABLET | Freq: Four times a day (QID) | ORAL | Status: DC | PRN

## 2020-11-06 MED ORDER — ASPIRIN 300 MG RE SUPP: 300.0000 mg | Freq: Every day | RECTAL | Status: DC

## 2020-11-06 MED ORDER — SODIUM CHLORIDE 0.9 % IV SOLN: INTRAVENOUS | Status: DC

## 2020-11-06 MED ORDER — ACETAMINOPHEN 325 MG PO TABS
650.0000 mg | ORAL_TABLET | ORAL | Status: DC | PRN
  Administered 2020-11-07: 650 mg via ORAL
  Filled 2020-11-06: qty 2

## 2020-11-06 MED ORDER — LORAZEPAM 0.5 MG PO TABS
0.5000 mg | ORAL_TABLET | Freq: Four times a day (QID) | ORAL | Status: DC | PRN
  Administered 2020-11-07: 0.5 mg via ORAL
  Filled 2020-11-06: qty 1

## 2020-11-06 MED ORDER — ENOXAPARIN SODIUM 60 MG/0.6ML ~~LOC~~ SOLN
60.0000 mg | SUBCUTANEOUS | Status: DC
  Administered 2020-11-06: 60 mg via SUBCUTANEOUS
  Filled 2020-11-06: qty 0.6

## 2020-11-06 MED ORDER — ENOXAPARIN SODIUM 40 MG/0.4ML ~~LOC~~ SOLN: 40.0000 mg | SUBCUTANEOUS | Status: DC

## 2020-11-06 NOTE — Consult Note (Signed)
[facility] TELEMEDICINE  CONSULT   Date of service: November 06, 2020 Patient Name: Marissa Simpson MRN:  [id number] DOB:  March 17, 1976 Requesting Provider: Coralee Pesa Location of the provider: [facility] office Location of the patient: [facility] Reason for consult: "Stroke code"   This consult was provided via telemedicine with 2-way video and audio communication. The patient/family was informed that care would be provided in this way and agreed to receive care in this manner.  History of Present Illness  Marissa Simpson is a 45 y.o. female with PMH significant for  has a past medical history of Carpal tunnel syndrome, History of stomach ulcers, Hypertension, Kidney stones (kidney stones) who presents with headache, lightheadedness, left sided numbness. She got up at 0800 and was fine. At 0930, left arm and leg and face went numb, face is still numb but the numbness in her left arm and leg is resolved in 15 mins.  She had CTH without contrast with no ICH or large territory infarct. Reports GI bleeding on Aspirin in the past due to ulcers.   Stroke Measures   Last Known Well: 0930 TPA Given: no, NIHSS of 1, symptoms are not disabling IR Thrombectomy: no, NIHSS of 1. mRS: Modified Rankin Scale: 0-Completely asymptomatic and back to baseline post- stroke Time of teleneurologist evaluation: 1226.   Vitals   Vitals:   11/06/20 1206 11/06/20 1206 11/06/20 1208  BP:  129/75   Pulse:  89   Resp:   16  Temp:  97.8 F (36.6 C)   TempSrc:  Oral   SpO2:  100%   Weight: 118.8 kg    Height: 5\' 3"  (1.6 m)       Body mass index is 46.41 kg/m.   Tele-neuro Exam and NIHSS   General: Awake, alert, talking provides history.  NIHSS components Score: Comment  1a Level of Conscious 0[x]  1[]  2[]  3[]      1b LOC Questions 0[x]  1[]  2[]       1c LOC Commands 0[x]  1[]  2[]       2 Best Gaze 0[x]  1[]  2[]       3 Visual 0[]  1[]  2[]  3[]      4 Facial Palsy 0[x]   1[]  2[]  3[]      5a Motor Arm - left 0[x]  1[]  2[]  3[]  4[]  UN[]    5b Motor Arm - Right 0[x]  1[]  2[]  3[]  4[]  UN[]    6a Motor Leg - Left 0[x]  1[]  2[]  3[]  4[]  UN[]    6b Motor Leg - Right 0[x]  1[]  2[]  3[]  4[]  UN[]    7 Limb Ataxia 0[x]  1[]  2[]  3[]  UN[]     8 Sensory 0[]  1[x]  2[]  UN[]      9 Best Language 0[x]  1[]  2[]  3[]      10 Dysarthria 0[x]  1[]  2[]  UN[]      11 Extinct. and Inattention 0[x]  1[]  2[]       TOTAL:       Other exam findings: None.  Imaging and Labs   CBC: No results for input(s): WBC, NEUTROABS, HGB, HCT, MCV, PLT in the last 168 hours.  Basic Metabolic Panel:  Lab Results  Component Value Date   NA 137 03/16/2016   K 4.0 03/16/2016   CO2 31 03/16/2016   GLUCOSE 80 03/16/2016   BUN 11 03/16/2016   CREATININE 0.95 03/16/2016   CALCIUM 9.7 03/16/2016   GFRNONAA >60 03/16/2016   GFRAA >60 03/16/2016   Lipid Panel: No results found for: LDLCALC HgbA1c: No results found for: HGBA1C Urine Drug Screen:  No results found for: LABOPIA, COCAINSCRNUR, LABBENZ, AMPHETMU, THCU, LABBARB  Alcohol Level No results found for: [facility]  CTH without contrast: Personally reviewed and CTH was negative for a large hypodensity concerning for a large territory infarct or hyperdensity concerning for an ICH    Impression   Marissa Simpson is a 45 y.o. female with PMH significant for HTN, Kidney stones, GI ulcers p/w acute onset L sided numbness with resolution of numbness in her arms and leg, still has numbness to touch in her L face. NIHSS of 1 and therefore tPA was not offered. Not a candidate for thrombectomy due to low NIHSS.  Recommendations  Plan:  - I ordered Aspirin EC 81mg  once or Apsirin suppository 300mg  once.  Recommend that primary team order following: - Frequent Neuro checks per stroke unit protocol - Recommend brain imaging with MRI Brain without contrast - Recommend Vascular imaging with MRA Angio Head without contrast and Carotid doppler - Recommend obtaining TTE  with bubble study - Recommend obtaining Lipid panel with LDL - Please start statin if LDL > 70 - Recommend HbA1c - Antithrombotic - Aspirin EC 81mg  daily - Recommend DVT ppx - SBP goal - permissive hypertension first 24 h < 220/110. Hold home meds.  - Recommend Telemetry monitoring for arrythmia - Recommend bedside swallow screen prior to PO intake. - Stroke education booklet - Recommend PT/OT/SLP consult - Recommend Urine Tox screen. - Recommend close monitoring of Neuro symptoms and call a stroke code again for worsening symptoms while within the tPA or thrombectomy window of 24 hours from symptom onset.  ______________________________________________________________________    This patient is receiving care for possible acute neurological changes. There was 20 minutes of care by this provider at the time of service, including time for direct evaluation via telemedicine, review of medical records, imaging studies and discussion of findings with providers, the patient and/or family.  [facility] Pager Number [phone]  If 7pm- 7am, please page neurology on call as listed in AMION.

## 2020-11-06 NOTE — Progress Notes (Signed)
Code Stroke Time Documentation   1215 Call Time 1219 Beeper Time 1220 Exam Started  1222 Exam Finished 1222 Images sent to [facility] 1222 Exam completed in Epic  1221 [facility] radiology called

## 2020-11-06 NOTE — H&P (Signed)
History and Physical  [facility]  [name]:[id number] DOB: January 14, 1976 DOA: 11/06/2020  PCP: Ignatius Specking, MD  Patient coming from: Home  Level of care: Telemetry  I have personally briefly reviewed patient's old medical records in [facility]  Chief Complaint: dizziness, headache, left side numbness  HPI: Marissa Simpson is a 45 y.o. female with medical history significant for hypertension, stomach ulcers and kidney stones presented to ED complaining of dizziness, lightheadedness, left sided numbness that started about 1.5 hours after waking up this morning. She has been having intermittent headaches all week.  She has had malaise and fatigue.  She reported numbness and loss of sensation on left side of body from face down to feet. She had no loss of bowel or bladder control.  She denies facial drooping.  No vision or hearing change.  Her symptoms have slowly been improving since arrival but have not completely resolved.     ED Course: code stroke was called. CT head without contrast was negative for acute findings.  Her labs and EKG were within normal limits.  Influenza and Sars 2 coronavirus testing was negative.  Serum alcohol was negative.  UA and UDS pending.  Teleneurology evaluated patient and determined that tPA would not be appropriate given the severity of symptoms were mild and improving.  They recommended admission for MRI/MRA, Echo and further stroke/TIA work up.    Review of Systems: Review of Systems  Constitutional: Positive for malaise/fatigue. Negative for chills, diaphoresis and fever.  HENT: Positive for congestion and sinus pain. Negative for ear discharge, ear pain, hearing loss, nosebleeds, sore throat and tinnitus.   Eyes: Negative.   Respiratory: Negative.  Negative for stridor.   Cardiovascular: Negative.   Gastrointestinal: Positive for constipation and heartburn. Negative for abdominal pain, diarrhea, melena, nausea and vomiting.   Genitourinary: Negative.   Musculoskeletal: Negative.   Skin: Negative for itching and rash.  Neurological: Positive for dizziness, tingling, sensory change and headaches. Negative for tremors, speech change, focal weakness, seizures, loss of consciousness and weakness.  Endo/Heme/Allergies: Negative.   Psychiatric/Behavioral: Negative.   All other systems reviewed and are negative.   Past Medical History:  Diagnosis Date  . Carpal tunnel syndrome   . History of stomach ulcers   . Hypertension   . Kidney stones kidney stones  . Kidney stones kidney stones    Past Surgical History:  Procedure Laterality Date  . CARPAL TUNNEL RELEASE    . TUBAL LIGATION       reports that she has never smoked. She has never used smokeless tobacco. She reports that she does not drink alcohol and does not use drugs.  Allergies  Allergen Reactions  . Aspirin     Patient can't take due to stomach ulcers.  Leodis Liverpool [Propoxyphene N-Acetaminophen] Itching  . Ibuprofen     Patient can't take due to stomach ulcers    History reviewed. No pertinent family history.  Prior to Admission medications   Medication Sig Start Date End Date Taking? Authorizing Provider  acetaminophen (TYLENOL) 500 MG tablet Take 1,000 mg by mouth every 4 (four) hours as needed for moderate pain.    [provider]  busPIRone (BUSPAR) 15 MG tablet Take 15 mg by mouth 3 (three) times daily.    [provider]  calcium carbonate (TUMS - DOSED IN MG ELEMENTAL CALCIUM) 500 MG chewable tablet Chew 1 tablet by mouth daily as needed for indigestion or heartburn.  [provider]  Calcium Carbonate-Simethicone (ALKA-SELTZER HEARTBURN + GAS) 750-80 MG CHEW Chew 1 tablet by mouth daily as needed (heartburn).    [provider]  clonazePAM (KLONOPIN) 0.5 MG tablet Take 0.25 mg by mouth daily as needed for anxiety.    [provider]  cyclobenzaprine (FLEXERIL) 10 MG tablet Take 1 tablet  (10 mg total) by mouth at bedtime as needed for muscle spasms. 03/16/16   Mesner, Barbara Cower, MD  omeprazole (PRILOSEC) 20 MG capsule Take 1 capsule (20 mg total) by mouth daily. 03/16/16   Mesner, Barbara Cower, MD  PARoxetine (PAXIL) 10 MG tablet Take 10 mg by mouth at bedtime.    [provider]  traMADol (ULTRAM) 50 MG tablet Take 1 tablet (50 mg total) by mouth every 6 (six) hours as needed for moderate pain. 03/16/16   Mesner, Barbara Cower, MD    Physical Exam: Vitals:   11/06/20 1206 11/06/20 1208 11/06/20 1300 11/06/20 1330  BP: 129/75  (!) 96/52 101/68  Pulse: 89  78 66  Resp:  16 17 18   Temp: 97.8 F (36.6 C)     TempSrc: Oral     SpO2: 100%  100% 97%  Weight:      Height:       Constitutional: NAD, calm, comfortable, no facial droop seen. Eyes: PERRL, lids and conjunctivae normal ENMT: Mucous membranes are moist. Posterior pharynx clear of any exudate or lesions. Normal dentition.  Neck: normal, supple, no masses, no thyromegaly Respiratory: clear to auscultation bilaterally, no wheezing, no crackles. Normal respiratory effort. No accessory muscle use.  Cardiovascular: normal s1, s2 sounds, no murmurs / rubs / gallops. No extremity edema. 2+ pedal pulses. No carotid bruits.  Abdomen: no tenderness, no masses palpated. No hepatosplenomegaly. Bowel sounds positive.  Musculoskeletal: no clubbing / cyanosis. No joint deformity upper and lower extremities. Good ROM, no contractures. Normal muscle tone.  Skin: no rashes, lesions, ulcers. No induration Neurologic: CN 2-12 grossly intact. Sensation intact, DTR normal. Strength 5/5 in all 4.  Psychiatric: Normal judgment and insight. Alert and oriented x 3. Normal mood.   Labs on Admission: I have personally reviewed following labs and imaging studies  CBC: Recent Labs  Lab 11/06/20 1214 11/06/20 1234  WBC 10.0  --   NEUTROABS 6.4  --   HGB 13.0 14.3  HCT 42.1 42.0  MCV 86.8  --   PLT 362  --    Basic Metabolic Panel: Recent Labs   Lab 11/06/20 1214 11/06/20 1234  NA 139 142  K 3.6 3.7  CL 105 103  CO2 28  --   GLUCOSE 88 84  BUN 15 15  CREATININE 0.95 1.00  CALCIUM 9.5  --    GFR: Estimated Creatinine Clearance: 87.7 mL/min (by C-G formula based on SCr of 1 mg/dL). Liver Function Tests: Recent Labs  Lab 11/06/20 1214  AST 21  ALT 17  ALKPHOS 77  BILITOT 0.4  PROT 7.9  ALBUMIN 3.8   No results for input(s): LIPASE, AMYLASE in the last 168 hours. No results for input(s): AMMONIA in the last 168 hours. Coagulation Profile: Recent Labs  Lab 11/06/20 1214  INR 1.1   Cardiac Enzymes: No results for input(s): CKTOTAL, CKMB, CKMBINDEX, TROPONINI in the last 168 hours. BNP (last 3 results) No results for input(s): PROBNP in the last 8760 hours. HbA1C: No results for input(s): HGBA1C in the last 72 hours. CBG: Recent Labs  Lab 11/06/20 1225  GLUCAP 73   Lipid Profile: No results  for input(s): CHOL, HDL, LDLCALC, TRIG, CHOLHDL, LDLDIRECT in the last 72 hours. Thyroid Function Tests: No results for input(s): TSH, T4TOTAL, FREET4, T3FREE, THYROIDAB in the last 72 hours. Anemia Panel: No results for input(s): VITAMINB12, FOLATE, FERRITIN, TIBC, IRON, RETICCTPCT in the last 72 hours. Urine analysis:    Component Value Date/Time   COLORURINE YELLOW 02/06/2015 1125   APPEARANCEUR CLEAR 02/06/2015 1125   LABSPEC 1.015 02/06/2015 1125   PHURINE 6.0 02/06/2015 1125   GLUCOSEU NEGATIVE 02/06/2015 1125   HGBUR NEGATIVE 02/06/2015 1125   BILIRUBINUR NEGATIVE 02/06/2015 1125   KETONESUR NEGATIVE 02/06/2015 1125   PROTEINUR NEGATIVE 02/06/2015 1125   UROBILINOGEN 0.2 02/06/2015 1125   NITRITE NEGATIVE 02/06/2015 1125   LEUKOCYTESUR NEGATIVE 02/06/2015 1125    Radiological Exams on Admission: CT HEAD CODE STROKE WO CONTRAST  Result Date: 11/06/2020 CLINICAL DATA:  Code stroke.  Dizziness and left-sided numbness. EXAM: CT HEAD WITHOUT CONTRAST TECHNIQUE: Contiguous axial images were obtained from  the base of the skull through the vertex without intravenous contrast. COMPARISON:  12/14/2008 FINDINGS: Brain: There is no evidence of an acute infarct, intracranial hemorrhage, mass, midline shift, or extra-axial fluid collection. The ventricles and sulci are normal. There is a chronic partially empty sella. Vascular: No hyperdense vessel. Skull: No fracture or suspicious osseous lesion. Sinuses/Orbits: Visualized paranasal sinuses and mastoid air cells are clear. Unremarkable orbits. Other: None. ASPECTS [facility]([place] Stroke Program Early CT Score) - Ganglionic level infarction (caudate, lentiform nuclei, internal capsule, insula, M1-M3 cortex): 7 - Supraganglionic infarction (M4-M6 cortex): 3 Total score (0-10 with 10 being normal): 10 IMPRESSION: No evidence of acute intracranial abnormality.  ASPECTS of 10. These results were called by telephone at the time of interpretation on 11/06/2020 at 12:32 pm to Dr. Coralee PesaKristie Horton, who verbally acknowledged these results. Electronically Signed   By: Sebastian AcheAllen  Grady M.D.   On: 11/06/2020 12:32   EKG: Independently reviewed. NSR, no ST-T abnormalities seen.   Assessment/Plan Principal Problem:   TIA (transient ischemic attack) Active Problems:   Hypertension   History of stomach ulcers   Kidney stones   Facial numbness   1. TIA - Appreciate teleneurology recommendations - Admit for telemetry monitoring, allow permissive hypertension, obtain MRI and MRA brain, obtain 2D echo with bubble study, check A1c, fasting lipids, TSH, Vitamin D, Vitamin B12.  Bedside swallow eval and heart healthy diet. Start statin for LDL>70. Continue aspirin 81 mg daily. Provide stroke education pamphlet. Consult to PT/OT/SLP.  Follow up on urine toxicology screen.  2. Essential hypertension - allow permissive hypertension in first 24 hours SBP<220//110. Holding home med for now.  3. History of stomach ulcers - protonix ordered for GI protection.   DVT prophylaxis: enoxaparin   Code  Status: full   Family Communication: bedside update   Disposition Plan: home   Consults called: neurology   Admission status: OBV  Level of care: Telemetry Standley Dakinslanford Rodrigo Mcgranahan MD [facility] How to contact the [facility] Attending or Consulting provider 7A - 7P or covering provider during after hours 7P -7A, for this patient?  1. Check the care team in [facility] and look for a) attending/consulting [facility] provider listed and b) the [facility] team listed 2. Log into [email or web address] and use [place]'s universal password to access. If you do not have the password, please contact the hospital operator. 3. Locate the [facility] provider you are looking for under [facility] and page to a number that you can be directly reached. 4. If you still have difficulty reaching  the provider, please page the [facility] (Director on Call) for the Hospitalists listed on amion for assistance.   If 7PM-7AM, please contact night-coverage [email or web address] Password [email or web address]  11/06/2020, 2:18 PM

## 2020-11-06 NOTE — ED Notes (Signed)
Refused rectal ASA. EDP aware and states 81mg  PO is sufficient.

## 2020-11-06 NOTE — ED Provider Notes (Signed)
[facility] EMERGENCY DEPARTMENT Provider Note   CSN: [id number] Arrival date & time: 11/06/20  1155  An emergency department physician performed an initial assessment on this suspected stroke patient at 1212.  History Chief Complaint  Patient presents with  . Numbness    Marissa Simpson is a 45 y.o. female.  HPI   45 year old female with past medical history of HTN and kidney stones presents the emergency department with concern for headache and numbness.  Patient evaluated in triage as a stroke page.  Patient states that she woke up this morning around 8 AM she felt well and was at baseline.  She admits that she has had an intermittent generalized headache for the past week.  Around 930 approximately 2 hours prior to arrival the patient states that while she was up and walking she felt lightheaded like she was about to pass out.  She sat down and following this developed the same generalized headache with associated decreased sensation to the left side of her body from her face down to her feet.  She denies of pins-and-needles sensation.  She states when she touched her left side if felt less sensitive than the right.  No focal weakness, ataxia, difficulty gripping objects, upper or lower extremity weakness.  No noted facial droop or speech changes.  No vision loss.  Past Medical History:  Diagnosis Date  . Carpal tunnel syndrome   . History of stomach ulcers   . Hypertension   . Kidney stones kidney stones  . Kidney stones kidney stones    There are no problems to display for this patient.   Past Surgical History:  Procedure Laterality Date  . CARPAL TUNNEL RELEASE    . TUBAL LIGATION       OB History    Gravida  3   Para  3   Term      Preterm      AB      Living        SAB      IAB      Ectopic      Multiple      Live Births              History reviewed. No pertinent family history.  Social History   Tobacco Use  . Smoking status:  Never Smoker  . Smokeless tobacco: Never Used  Substance Use Topics  . Alcohol use: No  . Drug use: No    Home Medications Prior to Admission medications   Medication Sig Start Date End Date Taking? Authorizing Provider  acetaminophen (TYLENOL) 500 MG tablet Take 1,000 mg by mouth every 4 (four) hours as needed for moderate pain.    [provider]  busPIRone (BUSPAR) 15 MG tablet Take 15 mg by mouth 3 (three) times daily.    [provider]  calcium carbonate (TUMS - DOSED IN MG ELEMENTAL CALCIUM) 500 MG chewable tablet Chew 1 tablet by mouth daily as needed for indigestion or heartburn.    [provider]  Calcium Carbonate-Simethicone (ALKA-SELTZER HEARTBURN + GAS) 750-80 MG CHEW Chew 1 tablet by mouth daily as needed (heartburn).    [provider]  clonazePAM (KLONOPIN) 0.5 MG tablet Take 0.25 mg by mouth daily as needed for anxiety.    [provider]  cyclobenzaprine (FLEXERIL) 10 MG tablet Take 1 tablet (10 mg total) by mouth at bedtime as needed for muscle spasms. 03/16/16   Mesner, Barbara Cower, MD  omeprazole (PRILOSEC)  20 MG capsule Take 1 capsule (20 mg total) by mouth daily. 03/16/16   Mesner, Barbara Cower, MD  PARoxetine (PAXIL) 10 MG tablet Take 10 mg by mouth at bedtime.    [provider]  traMADol (ULTRAM) 50 MG tablet Take 1 tablet (50 mg total) by mouth every 6 (six) hours as needed for moderate pain. 03/16/16   Mesner, Barbara Cower, MD    Allergies    Aspirin, Darvocet [propoxyphene n-acetaminophen], and Ibuprofen  Review of Systems   Review of Systems  Constitutional: Negative for chills and fever.  HENT: Negative for congestion.   Eyes: Negative for visual disturbance.  Respiratory: Negative for shortness of breath.   Cardiovascular: Negative for chest pain.  Gastrointestinal: Negative for abdominal pain, diarrhea and vomiting.  Genitourinary: Negative for dysuria.  Skin: Negative for rash.  Neurological: Positive for  light-headedness, numbness and headaches. Negative for syncope, facial asymmetry, speech difficulty and weakness.    Physical Exam Updated Vital Signs BP 129/75   Pulse 89   Temp 97.8 F (36.6 C) (Oral)   Resp 16   Ht 5\' 3"  (1.6 m)   Wt 118.8 kg   LMP  (LMP Unknown) Comment: irregular  SpO2 100%   BMI 46.41 kg/m   Physical Exam Vitals and nursing note reviewed.  Constitutional:      Appearance: Normal appearance.  HENT:     Head: Normocephalic.     Mouth/Throat:     Mouth: Mucous membranes are moist.  Cardiovascular:     Rate and Rhythm: Normal rate.  Pulmonary:     Effort: Pulmonary effort is normal. No respiratory distress.  Abdominal:     Palpations: Abdomen is soft.     Tenderness: There is no abdominal tenderness.  Skin:    General: Skin is warm.  Neurological:     Mental Status: She is alert and oriented to person, place, and time.     Comments: Decreased sensation to the left face, left upper and lower extremity.  No other acute neuro deficit noted on exam.  See NIH.  Psychiatric:        Mood and Affect: Mood normal.     ED Results / Procedures / Treatments   Labs (all labs ordered are listed, but only abnormal results are displayed) Labs Reviewed  RESP PANEL BY RT-PCR (FLU A&B, COVID) ARPGX2  ETHANOL  PROTIME-INR  APTT  CBC  DIFFERENTIAL  COMPREHENSIVE METABOLIC PANEL  RAPID URINE DRUG SCREEN, HOSP PERFORMED  URINALYSIS, ROUTINE W REFLEX MICROSCOPIC  I-STAT CHEM 8, ED  CBG MONITORING, ED  POC URINE PREG, ED    EKG EKG Interpretation  Date/Time:  Sunday November 06 2020 12:07:25 EST Ventricular Rate:  86 PR Interval:  146 QRS Duration: 92 QT Interval:  370 QTC Calculation: 442 R Axis:   51 Text Interpretation: Normal sinus rhythm Normal ECG NSR, normal EKG Confirmed by 02-09-1982 [id number]) on 11/06/2020 12:51:10 PM   Radiology CT HEAD CODE STROKE WO CONTRAST  Result Date: 11/06/2020 CLINICAL DATA:  Code stroke.  Dizziness and  left-sided numbness. EXAM: CT HEAD WITHOUT CONTRAST TECHNIQUE: Contiguous axial images were obtained from the base of the skull through the vertex without intravenous contrast. COMPARISON:  12/14/2008 FINDINGS: Brain: There is no evidence of an acute infarct, intracranial hemorrhage, mass, midline shift, or extra-axial fluid collection. The ventricles and sulci are normal. There is a chronic partially empty sella. Vascular: No hyperdense vessel. Skull: No fracture or suspicious osseous lesion. Sinuses/Orbits: Visualized paranasal sinuses and mastoid air  cells are clear. Unremarkable orbits. Other: None. ASPECTS [facility] Stroke Program Early CT Score) - Ganglionic level infarction (caudate, lentiform nuclei, internal capsule, insula, M1-M3 cortex): 7 - Supraganglionic infarction (M4-M6 cortex): 3 Total score (0-10 with 10 being normal): 10 IMPRESSION: No evidence of acute intracranial abnormality.  ASPECTS of 10. These results were called by telephone at the time of interpretation on 11/06/2020 at 12:32 pm to Dr. Coralee Pesa, who verbally acknowledged these results. Electronically Signed   By: Sebastian Ache M.D.   On: 11/06/2020 12:32    Procedures .Critical Care Performed by: Rozelle Logan, DO Authorized by: Rozelle Logan, DO   Critical care provider statement:    Critical care time (minutes):  45   Critical care was time spent personally by me on the following activities:  Discussions with consultants, evaluation of patient's response to treatment, examination of patient, ordering and performing treatments and interventions, ordering and review of laboratory studies, ordering and review of radiographic studies, pulse oximetry, re-evaluation of patient's condition, obtaining history from patient or surrogate and review of old charts   I assumed direction of critical care for this patient from another provider in my specialty: no       Medications Ordered in ED Medications  aspirin EC tablet  81 mg ( Oral See Alternative 11/06/20 1251)    Or  aspirin suppository 300 mg (300 mg Rectal Not Given 11/06/20 1251)    ED Course  I have reviewed the triage vital signs and the nursing notes.  Pertinent labs & imaging results that were available during my care of the patient were reviewed by me and considered in my medical decision making (see chart for details).    MDM Rules/Calculators/A&P                          45 year old female presents the emergency department with headache, lightheadedness and left-sided sensory loss.  Patient claims the symptoms started acutely about 2 hours ago.  Patient evaluated in triage as a stroke page.  She is an NIH of one for decreased sensation involving the left face, left upper and lower extremity.  Head CT without shows no acute finding.  Teleneurology has evaluated the patient.  Due to her low NIH TPA has not been recommended at this time.  They do recommend admission for TIA evaluation, due to her minimal symptoms no vascular imaging requested at this time.  We will plan for hospital admit, reevaluation and neuro consult for TIA evaluation.  Patients evaluation and results requires admission for further treatment and care. Patient agrees with admission plan, offers no new complaints and is stable/unchanged at time of admit.  Final Clinical Impression(s) / ED Diagnoses Final diagnoses:  None    Rx / DC Orders ED Discharge Orders    None       Rozelle Logan, DO 11/06/20 1321

## 2020-11-06 NOTE — ED Triage Notes (Signed)
Pt with dizziness, HA and left sided numbness since this morning at 0930.  Pressure to nose for a week, taking Cold and Sinus OTC medication.  Denies runny nose.

## 2020-11-06 NOTE — Plan of Care (Signed)
  Problem: Education: Goal: Knowledge of General Education information will improve Description: Including pain rating scale, medication(s)/side effects and non-pharmacologic comfort measures Outcome: Progressing   Problem: Health Behavior/Discharge Planning: Goal: Ability to manage health-related needs will improve Outcome: Progressing   Problem: Clinical Measurements: Goal: Ability to maintain clinical measurements within normal limits will improve Outcome: Progressing Goal: Will remain free from infection Outcome: Progressing Goal: Diagnostic test results will improve Outcome: Progressing Goal: Respiratory complications will improve Outcome: Progressing Goal: Cardiovascular complication will be avoided Outcome: Progressing   Problem: Nutrition: Goal: Adequate nutrition will be maintained Outcome: Progressing   Problem: Activity: Goal: Risk for activity intolerance will decrease Outcome: Progressing   Problem: Coping: Goal: Level of anxiety will decrease Outcome: Progressing   Problem: Elimination: Goal: Will not experience complications related to bowel motility Outcome: Progressing Goal: Will not experience complications related to urinary retention Outcome: Progressing   Problem: Safety: Goal: Ability to remain free from injury will improve Outcome: Progressing   Problem: Skin Integrity: Goal: Risk for impaired skin integrity will decrease Outcome: Progressing   

## 2020-11-07 ENCOUNTER — Observation Stay (HOSPITAL_COMMUNITY): Payer: Commercial Managed Care - PPO

## 2020-11-07 ENCOUNTER — Observation Stay (HOSPITAL_BASED_OUTPATIENT_CLINIC_OR_DEPARTMENT_OTHER): Payer: Commercial Managed Care - PPO

## 2020-11-07 DIAGNOSIS — E559 Vitamin D deficiency, unspecified: Secondary | ICD-10-CM

## 2020-11-07 DIAGNOSIS — G459 Transient cerebral ischemic attack, unspecified: Secondary | ICD-10-CM | POA: Diagnosis not present

## 2020-11-07 DIAGNOSIS — R7303 Prediabetes: Secondary | ICD-10-CM | POA: Diagnosis not present

## 2020-11-07 DIAGNOSIS — I10 Essential (primary) hypertension: Secondary | ICD-10-CM | POA: Diagnosis not present

## 2020-11-07 DIAGNOSIS — R200 Anesthesia of skin: Secondary | ICD-10-CM | POA: Diagnosis not present

## 2020-11-07 DIAGNOSIS — Z8711 Personal history of peptic ulcer disease: Secondary | ICD-10-CM | POA: Diagnosis not present

## 2020-11-07 LAB — LIPID PANEL
Cholesterol: 170 mg/dL (ref 0–200)
HDL: 50 mg/dL (ref >40)
LDL Cholesterol: 98 mg/dL (ref 0–99)
Total CHOL/HDL Ratio: 3.4 RATIO
Triglycerides: 112 mg/dL (ref <150)
VLDL: 22 mg/dL (ref 0–40)

## 2020-11-07 LAB — ECHOCARDIOGRAM COMPLETE BUBBLE STUDY
Area-P 1/2: 3.87 cm2
S' Lateral: 2.62 cm

## 2020-11-07 MED ORDER — VITAMIN D (ERGOCALCIFEROL) 1.25 MG (50000 UNIT) PO CAPS
50000.0000 [IU] | ORAL_CAPSULE | ORAL | Status: DC
  Administered 2020-11-07: 50000 [IU] via ORAL
  Filled 2020-11-07: qty 1

## 2020-11-07 MED ORDER — ATORVASTATIN CALCIUM 20 MG PO TABS: 20.0000 mg | ORAL_TABLET | Freq: Every evening | ORAL | Status: DC

## 2020-11-07 MED ORDER — VITAMIN D (ERGOCALCIFEROL) 1.25 MG (50000 UNIT) PO CAPS: 50000.0000 [IU] | ORAL_CAPSULE | ORAL | 0 refills | Status: AC

## 2020-11-07 MED ORDER — ATORVASTATIN CALCIUM 20 MG PO TABS
20.0000 mg | ORAL_TABLET | Freq: Every evening | ORAL | 1 refills | Status: AC
Start: 2020-11-07 — End: ?

## 2020-11-07 NOTE — Evaluation (Signed)
Occupational Therapy Evaluation Patient Details Name: Marissa Simpson MRN: [id number] DOB: 04/21/1976 Today's Date: 11/07/2020    History of Present Illness Marissa Simpson is a 45 y.o. female with medical history significant for hypertension, stomach ulcers and kidney stones presented to ED complaining of dizziness, lightheadedness, left sided numbness that started about 1.5 hours after waking up this morning. She has been having intermittent headaches all week.  She has had malaise and fatigue.  She reported numbness and loss of sensation on left side of body from face down to feet. She had no loss of bowel or bladder control.  She denies facial drooping.  No vision or hearing change.  Her symptoms have slowly been improving since arrival but have not completely resolved   Clinical Impression   Pt pleasant and agreeable to evaluation. Pt able to ambulate to sink to wash hands from EOB and return with no assistance. Pt appears to be at or near baseline. Pt able to complete localization of touch to L UE, sequential finger touching, and forearm alternating movements WFL. Pt not recommended for further Acute OT.     Follow Up Recommendations  No OT follow up    Equipment Recommendations  None recommended by OT           Precautions / Restrictions Precautions Precautions: None Restrictions Weight Bearing Restrictions: No      Mobility Bed Mobility Overal bed mobility: Independent                  Transfers Overall transfer level: Independent                    Balance Overall balance assessment: No apparent balance deficits (not formally assessed)                                         ADL either performed or assessed with clinical judgement   ADL Overall ADL's : Independent                                       General ADL Comments: Able to walk to sink from EOB and wash hands independently. No difficulty. Pt reports  feeling back at baseline.     Vision Baseline Vision/History: No visual deficits       Perception     Praxis      Pertinent Vitals/Pain Pain Assessment: No/denies pain     Hand Dominance Left   Extremity/Trunk Assessment Upper Extremity Assessment Upper Extremity Assessment: Overall WFL for tasks assessed (Able to localize touch to L UE 5/5. 4+ shouler flexion. WFL overall. Good sequential finger touching.)   Lower Extremity Assessment Lower Extremity Assessment: Defer to PT evaluation   Cervical / Trunk Assessment Cervical / Trunk Assessment: Normal   Communication Communication Communication: No difficulties   Cognition Arousal/Alertness: Awake/alert Behavior During Therapy: WFL for tasks assessed/performed Overall Cognitive Status: Within Functional Limits for tasks assessed                                                      Home Living Family/patient expects to be discharged to:: Private residence Living  Arrangements: Spouse/significant other Available Help at Discharge: Family;Available PRN/intermittently Type of Home: Apartment Home Access: Stairs to enter Entrance Stairs-Number of Steps: 16 Entrance Stairs-Rails: Right Home Layout: Two level     Bathroom Shower/Tub: Chief Strategy Officer: Standard Bathroom Accessibility: Yes How Accessible: Accessible via walker;Accessible via wheelchair Home Equipment: None          Prior Functioning/Environment Level of Independence: Independent        Comments: Tourist information centre manager, drives                      OT Goals(Current goals can be found in the care plan section) Acute Rehab OT Goals Patient Stated Goal: return home  OT Frequency:      AM-PAC OT "6 Clicks" Daily Activity     Outcome Measure Help from another person eating meals?: None Help from another person taking care of personal grooming?: None Help from another person toileting, which includes  using toliet, bedpan, or urinal?: None Help from another person bathing (including washing, rinsing, drying)?: None Help from another person to put on and taking off regular upper body clothing?: None Help from another person to put on and taking off regular lower body clothing?: None 6 Click Score: 24   End of Session    Activity Tolerance: Patient tolerated treatment well Patient left: with call bell/phone within reach;in bed                   Time: 1048-1056 OT Time Calculation (min): 8 min Charges:  OT General Charges $OT Visit: 1 Visit OT Evaluation $OT Eval Low Complexity: 1 Low  Khoury Siemon OT, MOT   Danie Chandler 11/07/2020, 11:42 AM

## 2020-11-07 NOTE — Progress Notes (Signed)
Pt discharged ambulatory per her request to POV with all personal belongings in her possession. Stroke education provided to pt, along with education booklet. Pt stated understanding.

## 2020-11-07 NOTE — Progress Notes (Signed)
*  PRELIMINARY RESULTS* Echocardiogram 2D Echocardiogram with bubble study x2 has been performed. Patient experienced a head rush during bubble studies.  Marissa Simpson 11/07/2020, 9:48 AM

## 2020-11-07 NOTE — Evaluation (Signed)
Physical Therapy Evaluation Patient Details Name: Marissa Simpson MRN: [id number] DOB: 1975/10/29 Today's Date: 11/07/2020   History of Present Illness  Marissa Simpson is a 45 y.o. female with medical history significant for hypertension, stomach ulcers and kidney stones presented to ED complaining of dizziness, lightheadedness, left sided numbness that started about 1.5 hours after waking up this morning. She has been having intermittent headaches all week.  She has had malaise and fatigue.  She reported numbness and loss of sensation on left side of body from face down to feet. She had no loss of bowel or bladder control.  She denies facial drooping.  No vision or hearing change.  Her symptoms have slowly been improving since arrival but have not completely resolved    Clinical Impression  Patient functioning at baseline for functional mobility and gait. Plan:  Patient discharged from physical therapy to care of nursing for ambulation daily as tolerated for length of stay.    Follow Up Recommendations No PT follow up    Equipment Recommendations  None recommended by PT    Recommendations for Other Services       Precautions / Restrictions Precautions Precautions: None Restrictions Weight Bearing Restrictions: No      Mobility  Bed Mobility Overal bed mobility: Independent                  Transfers Overall transfer level: Independent                  Ambulation/Gait Ambulation/Gait assistance: Independent Gait Distance (Feet): 200 Feet   Gait Pattern/deviations: WFL(Within Functional Limits) Gait velocity: normal   General Gait Details: demonstrates good return for ambulation on level, inclinded and declined surfaces without loss of balance  Stairs            Wheelchair Mobility    Modified Rankin (Stroke Patients Only)       Balance Overall balance assessment: No apparent balance deficits (not formally assessed)                                            Pertinent Vitals/Pain Pain Assessment: No/denies pain    Home Living Family/patient expects to be discharged to:: Private residence Living Arrangements: Spouse/significant other Available Help at Discharge: Family;Available PRN/intermittently Type of Home: Apartment Home Access: Stairs to enter Entrance Stairs-Rails: Right Entrance Stairs-Number of Steps: 16 Home Layout: Two level Home Equipment: None      Prior Function Level of Independence: Independent         Comments: Tourist information centre manager, drives     Higher education careers adviser        Extremity/Trunk Assessment   Upper Extremity Assessment Upper Extremity Assessment: Defer to OT evaluation    Lower Extremity Assessment Lower Extremity Assessment: Overall WFL for tasks assessed    Cervical / Trunk Assessment Cervical / Trunk Assessment: Normal  Communication   Communication: No difficulties  Cognition Arousal/Alertness: Awake/alert Behavior During Therapy: WFL for tasks assessed/performed Overall Cognitive Status: Within Functional Limits for tasks assessed                                        General Comments      Exercises     Assessment/Plan    PT Assessment Patent does not need any  further PT services  PT Problem List         PT Treatment Interventions      PT Goals (Current goals can be found in the Care Plan section)  Acute Rehab PT Goals Patient Stated Goal: return home PT Goal Formulation: With patient Time For Goal Achievement: 11/07/20 Potential to Achieve Goals: Good    Frequency     Barriers to discharge        Co-evaluation               AM-PAC PT "6 Clicks" Mobility  Outcome Measure Help needed turning from your back to your side while in a flat bed without using bedrails?: None Help needed moving from lying on your back to sitting on the side of a flat bed without using bedrails?: None Help needed moving to and from  a bed to a chair (including a wheelchair)?: None Help needed standing up from a chair using your arms (e.g., wheelchair or bedside chair)?: None Help needed to walk in hospital room?: None Help needed climbing 3-5 steps with a railing? : None 6 Click Score: 24    End of Session   Activity Tolerance: Patient tolerated treatment well Patient left: in bed;with call bell/phone within reach Nurse Communication: Mobility status PT Visit Diagnosis: Unsteadiness on feet (R26.81);Other abnormalities of gait and mobility (R26.89);Muscle weakness (generalized) (M62.81)    Time: 6578-4696 PT Time Calculation (min) (ACUTE ONLY): 15 min   Charges:   PT Evaluation $PT Eval Low Complexity: 1 Low PT Treatments $Therapeutic Activity: 8-22 mins        8:52 AM, 11/07/20 Ocie Bob, MPT Physical Therapist with [facility] [phone] office [phone] mobile phone

## 2020-11-07 NOTE — Discharge Instructions (Signed)
Transient Ischemic Attack A transient ischemic attack (TIA) causes the same symptoms as a stroke, but the symptoms go away quickly. A TIA happens when blood flow to the brain is blocked. Having a TIA means you may be at risk for a stroke. A TIA is a medical emergency. What are the causes? A TIA is caused by a blocked artery in the head or neck. This means the brain does not get the blood supply it needs. A blockage can be caused by:  Fatty buildup in an artery in the head or neck.  A blood clot.  A tear in an artery.  Irritation and swelling (inflammation) of an artery. Sometimes the cause is not known. What increases the risk? Certain things may make you more likely to have a TIA. Some of these are things that you can change, such as:  Being very overweight.  Using products that have nicotine or tobacco.  Taking birth control pills.  Not being active.  Drinking too much alcohol.  Using drugs. Health conditions that may increase your risk include:  High blood pressure.  High cholesterol.  Diabetes.  Heart disease.  A heartbeat that is not regular (atrial fibrillation).  Sickle cell disease.  Sleep problems (sleep apnea).  Long-term diseases that cause irritation and swelling.  Problems with blood clotting. Other risk factors include:  Being over the age of 54.  Being female.  Having a family history of stroke.  Having had blood clots, stroke, TIA, or heart attack in the past.  Having a history of high blood pressure when pregnant (preeclampsia).  Very bad headaches (migraines). What are the signs or symptoms? The symptoms of a TIA are like those of a stroke. They can include:  Weakness or loss of feeling in your face, arm, or leg. This often happens on one side of your body.  Trouble walking.  Trouble moving your arms or legs.  Trouble talking or understanding what people are saying.  Problems with how you see.  Feeling dizzy.  Feeling  confused.  Loss of balance or coordination.  Feeling like you may vomit (nausea) or you vomit.  Having a very bad headache. If you can, note what time you started to have symptoms. Tell your doctor.   How is this treated? The goal of treatment is to lower the risk for a stroke. This may include:  Changes to diet and lifestyle, such as getting regular exercise and stopping smoking.  Taking medicines to: ? Thin the blood. ? Lower blood pressure. ? Lower cholesterol.  Treating other health conditions, such as diabetes. If testing shows that an artery in your brain is narrow, your doctor may recommend a procedure to:  Take the blockage out of your artery (carotid endarterectomy).  Open or widen an artery in your neck (carotid angioplasty and stenting). Follow these instructions at home: Medicines  Take over-the-counter and prescription medicines only as told by your doctor.  If you were told to take aspirin or another medicine to thin your blood, take it exactly as told by your doctor. ? Taking too much of the medicine can cause bleeding. ? Taking too little of the medicine may not work to treat the problem. Eating and drinking  Eat 5 or more servings of fruits and vegetables each day.  Follow instructions from your doctor about your diet. You may need to follow a certain diet to help lower your risk of a stroke. You may need to: ? Eat a diet that is low  in fat and salt. ? Eat foods with a lot of fiber. ? Limit carbohydrates and sugar.  If you drink alcohol: ? Limit how much you have to:  0-1 drink a day for women who are not pregnant.  0-2 drinks a day for men. ? Know how much alcohol is in a drink. In the [place] one drink equals one 12 oz bottle of beer ( ), one 5 oz glass of wine ( ), or one 1 oz glass of hard liquor (75mL).   General instructions  Keep a healthy weight.  Try to get at least 30 minutes of exercise on most days.  Get treatment if you have  sleep problems.  Do not smoke or use any products that contain nicotine or tobacco. If you need help quitting, ask your doctor.  Do not use drugs.  Keep all follow-up visits. Where to find more information  American Stroke Association: www.stroke.org Get help right away if:  You have chest pain.  You have a heartbeat that is not regular.  You have any signs of a stroke. "BE FAST" is an easy way to remember the main warning signs: ? B - Balance. Dizziness, sudden trouble walking, or loss of balance. ? E - Eyes. Trouble seeing or a change in how you see. ? F - Face. Sudden weakness or loss of feeling of the face. The face or eyelid may droop on one side. ? A - Arms. Weakness or loss of feeling in an arm. This happens all of a sudden and most often on one side of the body. ? S - Speech. Sudden trouble speaking, slurred speech, or trouble understanding what people say. ? T - Time. Time to call emergency services. Write down what time symptoms started.  You have other signs of a stroke, such as: ? A sudden, very bad headache with no known cause. ? Feeling like you may vomit. ? Vomiting. ? A seizure. These symptoms may be an emergency. Get help right away. Call your local emergency services (911 in the [place]  Do not wait to see if the symptoms will go away.  Do not drive yourself to the hospital. Summary  A transient ischemic attack (TIA) happens when an artery in the head or neck is blocked. This causes the same symptoms as a stroke. The symptoms go away quickly.  A TIA is a medical emergency. Get help right away, even if your symptoms go away.  Having a TIA means that you may be at risk for a stroke. Taking medicines and making diet and lifestyle changes can help to prevent a stroke. This information is not intended to replace advice given to you by your health care provider. Make sure you discuss any questions you have with your health care provider. Document Revised:  03/29/2020 Document Reviewed: 03/29/2020 Elsevier Patient Education  2021 [employer]   IMPORTANT INFORMATION: PAY CLOSE ATTENTION   PHYSICIAN DISCHARGE INSTRUCTIONS  Follow with Primary care provider  Ignatius Specking, MD  and other consultants as instructed by your Hospitalist Physician  SEEK MEDICAL CARE OR RETURN TO EMERGENCY ROOM IF SYMPTOMS COME BACK, WORSEN OR NEW PROBLEM DEVELOPS   Please note: You were cared for by a hospitalist during your hospital stay. Every effort will be made to forward records to your primary care provider.  You can request that your primary care provider send for your hospital records if they have not received them.  Once you are discharged, your primary care physician will handle any  further medical issues. Please note that NO REFILLS for any discharge medications will be authorized once you are discharged, as it is imperative that you return to your primary care physician (or establish a relationship with a primary care physician if you do not have one) for your post hospital discharge needs so that they can reassess your need for medications and monitor your lab values.  Please get a complete blood count and chemistry panel checked by your Primary MD at your next visit, and again as instructed by your Primary MD.  Get Medicines reviewed and adjusted: Please take all your medications with you for your next visit with your Primary MD  Laboratory/radiological data: Please request your Primary MD to go over all hospital tests and procedure/radiological results at the follow up, please ask your primary care provider to get all Hospital records sent to his/her office.  In some cases, they will be blood work, cultures and biopsy results pending at the time of your discharge. Please request that your primary care provider follow up on these results.  If you are diabetic, please bring your blood sugar readings with you to your follow up appointment with primary  care.    Please call and make your follow up appointments as soon as possible.    Also Note the following: If you experience worsening of your admission symptoms, develop shortness of breath, life threatening emergency, suicidal or homicidal thoughts you must seek medical attention immediately by calling 911 or calling your MD immediately  if symptoms less severe.  You must read complete instructions/literature along with all the possible adverse reactions/side effects for all the Medicines you take and that have been prescribed to you. Take any new Medicines after you have completely understood and accpet all the possible adverse reactions/side effects.   Do not drive when taking Pain medications or sleeping medications (Benzodiazepines)  Do not take more than prescribed Pain, Sleep and Anxiety Medications. It is not advisable to combine anxiety,sleep and pain medications without talking with your primary care practitioner  Special Instructions: If you have smoked or chewed Tobacco  in the last 2 yrs please stop smoking, stop any regular Alcohol  and or any Recreational drug use.  Wear Seat belts while driving.  Do not drive if taking any narcotic, mind altering or controlled substances or recreational drugs or alcohol.

## 2020-11-07 NOTE — Progress Notes (Signed)
SLP Cancellation Note  Patient Details Name: Marissa Simpson MRN: [id number] DOB: 1975/12/24   Cancelled treatment:       Reason Eval/Treat Not Completed: SLP screened, no needs identified, will sign off; SLP screened Pt in room. Pt denies any changes in swallowing, speech, language, or cognition. MRI negative for acute changes. SLE will be deferred at this time. Reconsult if indicated. SLP will sign off.   Thank you,  Havery Moros, CCC-SLP [phone]    [name] 11/07/2020, 2:28 PM

## 2020-11-07 NOTE — Discharge Summary (Addendum)
Physician Discharge Summary  Marissa Simpson QIO:[id number] DOB: [birth year] DOA: 11/06/2020  PCP: Ignatius Specking, MD  Admit date: 11/06/2020 Discharge date: 11/07/2020  Admitted From: Home Disposition:  Home  Recommendations for Outpatient Follow-up:  1. Follow up with PCP in 1-2 weeks 2. Starting patient on atorvastatin 20mg , will need to be increased to 40mg  in 1-2 weeks. 3. Patient with newly diagnosed prediabetes; recommend counseling regarding lifestyle changes to avoid development of diabetes   Home Health: None Equipment/Devices: None  Discharge Condition: At baseline CODE STATUS:Full Diet recommendation: Regular  Brief/Interim Summary: Ms. Kumagai presented to the [facility] ED on 12/20 with new onset dizziness, lightheadedness, and left-sided numbness. Code stroke was activated and CT Head was negative for acute findings. Her clinical symptoms rapidly improved and she was deemed not to be a tPA candidate. She was admitted for further workup. Echo bubble study showed an EF of 50-55%, intact diastolic dysfunction, and no shunt. Carotid Myna Bright also normal. MRI/MRA head normal.   Discharge Diagnoses:  Principal Problem:   TIA (transient ischemic attack) Active Problems:   Hypertension   History of stomach ulcers   Kidney stones   Facial numbness   Prediabetes   Vitamin D deficiency  TIA Full recovery of function. Imaging normal.  - Patient started on statin for LDL>90  Prediabetes New diagnosis this admission. A1c 5.9%. Patient motivated to make lifestyle changes. - Close PCP follow-up - Carb modified diet   Vitamin D deficiency -Drisdol 50K IU weekly x 5  -Follow up with PCP.   Discharge Instructions  Allergies as of 11/07/2020      Reactions   Aspirin    Patient can't take due to stomach ulcers.   Darvocet [propoxyphene N-acetaminophen] Itching   Ibuprofen    Patient can't take due to stomach ulcers      Medication List    TAKE these medications   acetaminophen  500 MG tablet Commonly known as: TYLENOL Take 1,000 mg by mouth every 4 (four) hours as needed for moderate pain.   amLODipine 2.5 MG tablet Commonly known as: NORVASC Take 2.5 mg by mouth daily.   atorvastatin 20 MG tablet Commonly known as: LIPITOR Take 1 tablet (20 mg total) by mouth every evening.   calcium carbonate 500 MG chewable tablet Commonly known as: TUMS - dosed in mg elemental calcium Chew 1 tablet by mouth daily as needed for indigestion or heartburn.   Calcium Carbonate-Simethicone 750-80 MG Chew Chew 1 tablet by mouth daily as needed (heartburn).   clonazePAM 0.5 MG tablet Commonly known as: KLONOPIN Take 0.25 mg by mouth daily as needed for anxiety.   cyclobenzaprine 10 MG tablet Commonly known as: FLEXERIL Take 1 tablet (10 mg total) by mouth at bedtime as needed for muscle spasms.   hydrochlorothiazide 12.5 MG capsule Commonly known as: MICROZIDE Take 12.5 mg by mouth daily.   pseudoephedrine-acetaminophen 30-500 MG Tabs tablet Commonly known as: TYLENOL SINUS Take 1 tablet by mouth every 4 (four) hours as needed.   traMADol 50 MG tablet Commonly known as: ULTRAM Take 1 tablet (50 mg total) by mouth every 6 (six) hours as needed for moderate pain.   Vitamin D (Ergocalciferol) 1.25 MG (50000 UNIT) Caps capsule Commonly known as: DRISDOL Take 1 capsule (50,000 Units total) by mouth every 7 (seven) days. Start taking on: November 14, 2020       Follow-up Information    Vyas, 11/09/2020, MD. Schedule an appointment as soon as possible for a visit in  1 week(s).   Specialty: Internal Medicine Contact information: [address] [place] [address] [phone]              Allergies  Allergen Reactions  . Aspirin     Patient can't take due to stomach ulcers.  Leodis Liverpool [Propoxyphene N-Acetaminophen] Itching  . Ibuprofen     Patient can't take due to stomach ulcers    Consultations:  None   Procedures/Studies: MR ANGIO HEAD WO  CONTRAST  Result Date: 11/07/2020 CLINICAL DATA:  TIA.  Headache and dizziness.  Left-sided numbness. EXAM: MRI HEAD WITHOUT CONTRAST MRA HEAD WITHOUT CONTRAST TECHNIQUE: Multiplanar, multiecho pulse sequences of the brain and surrounding structures were obtained without intravenous contrast. Angiographic images of the head were obtained using MRA technique without contrast. COMPARISON:  CT head 11/06/2020 FINDINGS: MRI HEAD FINDINGS Brain: No acute infarction, hemorrhage, hydrocephalus, extra-axial collection or mass lesion. Normal cerebral white matter. Negative for demyelinating disease. Vascular: Normal arterial flow voids. Skull and upper cervical spine: Negative Sinuses/Orbits: Paranasal sinuses clear.  Negative orbit Other: None MRA HEAD FINDINGS Internal carotid artery normal bilaterally. Anterior and middle cerebral arteries normal bilaterally. Normal posterior circulation without stenosis or occlusion. Fetal origin right posterior cerebral artery. Left posterior communicating artery patent. IMPRESSION: Normal MRA head Normal MRI head. Electronically Signed   By: Marlan Palau M.D.   On: 11/07/2020 12:48   MR BRAIN WO CONTRAST  Result Date: 11/07/2020 CLINICAL DATA:  TIA.  Headache and dizziness.  Left-sided numbness. EXAM: MRI HEAD WITHOUT CONTRAST MRA HEAD WITHOUT CONTRAST TECHNIQUE: Multiplanar, multiecho pulse sequences of the brain and surrounding structures were obtained without intravenous contrast. Angiographic images of the head were obtained using MRA technique without contrast. COMPARISON:  CT head 11/06/2020 FINDINGS: MRI HEAD FINDINGS Brain: No acute infarction, hemorrhage, hydrocephalus, extra-axial collection or mass lesion. Normal cerebral white matter. Negative for demyelinating disease. Vascular: Normal arterial flow voids. Skull and upper cervical spine: Negative Sinuses/Orbits: Paranasal sinuses clear.  Negative orbit Other: None MRA HEAD FINDINGS Internal carotid artery normal  bilaterally. Anterior and middle cerebral arteries normal bilaterally. Normal posterior circulation without stenosis or occlusion. Fetal origin right posterior cerebral artery. Left posterior communicating artery patent. IMPRESSION: Normal MRA head Normal MRI head. Electronically Signed   By: Marlan Palau M.D.   On: 11/07/2020 12:48   US Carotid Bilateral (at [facility] and [facility] only)  Result Date: 11/07/2020 CLINICAL DATA:  TIA Left-sided body numbness EXAM: BILATERAL CAROTID DUPLEX ULTRASOUND TECHNIQUE: Wallace Cullens scale imaging, color Doppler and duplex ultrasound were performed of bilateral carotid and vertebral arteries in the neck. COMPARISON:  None. FINDINGS: Criteria: Quantification of carotid stenosis is based on velocity parameters that correlate the residual internal carotid diameter with NASCET-based stenosis levels, using the diameter of the distal internal carotid lumen as the denominator for stenosis measurement. The following velocity measurements were obtained: RIGHT ICA: 110/46 cm/sec CCA: 80/18 cm/sec SYSTOLIC ICA/CCA RATIO:  89 ECA: 1.4 cm/sec LEFT ICA: 61/14 cm/sec CCA: 105/25 cm/sec SYSTOLIC ICA/CCA RATIO:  0.6 ECA: 85 cm/sec RIGHT CAROTID ARTERY: Elevated end-diastolic velocity likely artifact given that there is no significant atherosclerotic disease of the internal carotid artery. RIGHT VERTEBRAL ARTERY:  Antegrade flow. LEFT CAROTID ARTERY: No significant atherosclerotic disease. No significant stenosis. LEFT VERTEBRAL ARTERY:  Antegrade flow. IMPRESSION: No significant narrowing of internal carotid arteries. Electronically Signed   By: Acquanetta Belling M.D.   On: 11/07/2020 11:12   ECHOCARDIOGRAM COMPLETE BUBBLE STUDY  Result Date: 11/07/2020    ECHOCARDIOGRAM REPORT  Patient Name:   MAIZIE GARNO [facility] Date of Exam: 11/07/2020 Medical Rec #:  [id number]         Height:       63.0 in Accession #:    [id number]        Weight:       262.0 lb Date of Birth:  [birth year]         BSA:          2.169 m  Patient Age:    44 years          BP:           105/60 mmHg Patient Gender: F                 HR:           83 bpm. [facility] Procedure: 2D Echo and Saline Contrast Bubble Study Indications:    TIA (transient ischemic attack) 435.9 / G45.9  History:        Patient has no prior history of Echocardiogram examinations.                 Risk Factors:Non-Smoker and Hypertension. Prediabetes, Facial                 Numbness.  Sonographer:    Jeryl Columbia RDCS (AE) Referring Phys: [id number] Jadiel Schmieder L Steed Kanaan IMPRESSIONS  1. Inferior basal hypokinesis . Left ventricular ejection fraction, by estimation, is 50 to 55%. The left ventricle has low normal function. The left ventricle has no regional wall motion abnormalities. There is mild left ventricular hypertrophy. Left ventricular diastolic parameters were normal.  2. Right ventricular systolic function is normal. The right ventricular size is normal.  3. Bubble study negative for right to left shunt.  4. The mitral valve is normal in structure. Trivial mitral valve regurgitation. No evidence of mitral stenosis.  5. The aortic valve is normal in structure. Aortic valve regurgitation is not visualized. No aortic stenosis is present.  6. The inferior vena cava is normal in size with greater than 50% respiratory variability, suggesting right atrial pressure of 3 mmHg. FINDINGS  Left Ventricle: Inferior basal hypokinesis. Left ventricular ejection fraction, by estimation, is 50 to 55%. The left ventricle has low normal function. The left ventricle has no regional wall motion abnormalities. The left ventricular internal cavity size was normal in size. There is mild left ventricular hypertrophy. Left ventricular diastolic parameters were normal. Right Ventricle: The right ventricular size is normal. No increase in right ventricular wall thickness. Right ventricular systolic function is normal. Left Atrium: Left atrial size was normal in size. Right Atrium: Right  atrial size was normal in size. Pericardium: There is no evidence of pericardial effusion. Mitral Valve: The mitral valve is normal in structure. Trivial mitral valve regurgitation. No evidence of mitral valve stenosis. Tricuspid Valve: The tricuspid valve is normal in structure. Tricuspid valve regurgitation is trivial. No evidence of tricuspid stenosis. Aortic Valve: The aortic valve is normal in structure. Aortic valve regurgitation is not visualized. No aortic stenosis is present. Pulmonic Valve: The pulmonic valve was normal in structure. Pulmonic valve regurgitation is not visualized. No evidence of pulmonic stenosis. Aorta: The aortic root is normal in size and structure. Venous: The inferior vena cava is normal in size with greater than 50% respiratory variability, suggesting right atrial pressure of 3 mmHg. IAS/Shunts: No atrial level shunt detected by color flow Doppler. Agitated saline contrast was given intravenously to evaluate for intracardiac  shunting.  LEFT VENTRICLE PLAX 2D LVIDd:         3.28 cm  Diastology LVIDs:         2.62 cm  LV e' medial:    6.42 cm/s LV PW:         1.01 cm  LV E/e' medial:  13.3 LV IVS:        1.24 cm  LV e' lateral:   7.40 cm/s LVOT diam:     2.00 cm  LV E/e' lateral: 11.6 LVOT Area:     3.14 cm  RIGHT VENTRICLE RV S prime:     12.60 cm/s TAPSE (M-mode): 2.0 cm LEFT ATRIUM             Index       RIGHT ATRIUM          Index LA diam:        3.10 cm 1.43 cm/m  RA Area:     7.20 cm LA Vol (A2C):   45.9 ml 21.16 ml/m RA Volume:   10.20 ml 4.70 ml/m LA Vol (A4C):   42.6 ml 19.64 ml/m LA Biplane Vol: 47.0 ml 21.67 ml/m   AORTA Ao Root diam: 2.60 cm MITRAL VALVE MV Area (PHT): 3.87 cm    SHUNTS MV Decel Time: 196 msec    Systemic Diam: 2.00 cm MV E velocity: 85.70 cm/s MV A velocity: 80.60 cm/s MV E/A ratio:  1.06 Charlton Haws MD Electronically signed by Charlton Haws MD Signature Date/Time: 11/07/2020/9:51:32 AM    Final    CT HEAD CODE STROKE WO CONTRAST  Result Date:  11/06/2020 CLINICAL DATA:  Code stroke.  Dizziness and left-sided numbness. EXAM: CT HEAD WITHOUT CONTRAST TECHNIQUE: Contiguous axial images were obtained from the base of the skull through the vertex without intravenous contrast. COMPARISON:  12/14/2008 FINDINGS: Brain: There is no evidence of an acute infarct, intracranial hemorrhage, mass, midline shift, or extra-axial fluid collection. The ventricles and sulci are normal. There is a chronic partially empty sella. Vascular: No hyperdense vessel. Skull: No fracture or suspicious osseous lesion. Sinuses/Orbits: Visualized paranasal sinuses and mastoid air cells are clear. Unremarkable orbits. Other: None. ASPECTS [facility] Stroke Program Early CT Score) - Ganglionic level infarction (caudate, lentiform nuclei, internal capsule, insula, M1-M3 cortex): 7 - Supraganglionic infarction (M4-M6 cortex): 3 Total score (0-10 with 10 being normal): 10 IMPRESSION: No evidence of acute intracranial abnormality.  ASPECTS of 10. These results were called by telephone at the time of interpretation on 11/06/2020 at 12:32 pm to Dr. Coralee Pesa, who verbally acknowledged these results. Electronically Signed   By: Sebastian Ache M.D.   On: 11/06/2020 12:32    Subjective: Ms. Favero reports that she feels back to her baseline with no residual weakness, tingling, or dizziness. She is amenable to discharge home and feels confident that she will be able to follow-up with her PCP to address her prediabetes and secondary prevention of future cardiovascular disease.   Discharge Exam: Vitals:   11/06/20 1541 11/06/20 2133 11/07/20 0600 11/07/20 0938  BP: 112/68 128/66 124/77 105/60  Pulse: 87 (!) 107 91 83  Resp: Temp: 98.2 F (36.8 C) 98.4 F (36.9 C) 98.3 F (36.8 C) 98.2 F (36.8 C)  TempSrc: Oral Oral Oral   SpO2: 98% 98% 99% 100%  Weight:      Height:        Physical Exam Vitals reviewed.  Constitutional:      General: She is not  in acute  distress.    Appearance: Normal appearance.  Cardiovascular:     Rate and Rhythm: Normal rate and regular rhythm.  Pulmonary:     Effort: Pulmonary effort is normal. No respiratory distress.  Neurological:     General: No focal deficit present.     Mental Status: She is alert.     Sensory: No sensory deficit.     Motor: No weakness.  Psychiatric:        Mood and Affect: Mood normal.        Behavior: Behavior normal.    The results of significant diagnostics from this hospitalization (including imaging, microbiology, ancillary and laboratory) are listed below for reference.     Microbiology: Recent Results (from the past 240 hour(s))  Resp Panel by RT-PCR (Flu A&B, Covid) Nasopharyngeal Swab     Status: None   Collection Time: 11/06/20 12:14 PM   Specimen: Nasopharyngeal Swab; Nasopharyngeal(NP) swabs in vial transport medium  Result Value Ref Range Status   SARS Coronavirus 2 by RT PCR NEGATIVE NEGATIVE Final    Comment: (NOTE) SARS-CoV-2 target nucleic acids are NOT DETECTED.  The SARS-CoV-2 RNA is generally detectable in upper respiratory specimens during the acute phase of infection. The lowest concentration of SARS-CoV-2 viral copies this assay can detect is 138 copies/mL. A negative result does not preclude SARS-Cov-2 infection and should not be used as the sole basis for treatment or other patient management decisions. A negative result may occur with  improper specimen collection/handling, submission of specimen other than nasopharyngeal swab, presence of viral mutation(s) within the areas targeted by this assay, and inadequate number of viral copies(<138 copies/mL). A negative result must be combined with clinical observations, patient history, and epidemiological information. The expected result is Negative.  Fact Sheet for Patients:  BloggerCourse.com  Fact Sheet for Healthcare Providers:   SeriousBroker.it  This test is no t yet approved or cleared by the [place] FDA and  has been authorized for detection and/or diagnosis of SARS-CoV-2 by FDA under an Emergency Use Authorization (EUA). This EUA will remain  in effect (meaning this test can be used) for the duration of the COVID-19 declaration under Section 564(b)(1) of the Act, 21 U.S.C.section 360bbb-3(b)(1), unless the authorization is terminated  or revoked sooner.       Influenza A by PCR NEGATIVE NEGATIVE Final   Influenza B by PCR NEGATIVE NEGATIVE Final    Comment: (NOTE) The Xpert Xpress SARS-CoV-2/FLU/RSV plus assay is intended as an aid in the diagnosis of influenza from Nasopharyngeal swab specimens and should not be used as a sole basis for treatment. Nasal washings and aspirates are unacceptable for Xpert Xpress SARS-CoV-2/FLU/RSV testing.  Fact Sheet for Patients: BloggerCourse.com  Fact Sheet for Healthcare Providers: SeriousBroker.it  This test is not yet approved or cleared by the [place] FDA and has been authorized for detection and/or diagnosis of SARS-CoV-2 by FDA under an Emergency Use Authorization (EUA). This EUA will remain in effect (meaning this test can be used) for the duration of the COVID-19 declaration under Section 564(b)(1) of the Act, 21 U.S.C. section 360bbb-3(b)(1), unless the authorization is terminated or revoked.  Performed at [facility], [address] [place], [place] [address]      Labs: BNP (last 3 results) No results for input(s): BNP in the last 8760 hours. Basic Metabolic Panel: Recent Labs  Lab 11/06/20 1214 11/06/20 1234  NA 139 142  K 3.6 3.7  CL 105 103  CO2 28  --  GLUCOSE 88 84  BUN 15 15  CREATININE 0.95 1.00  CALCIUM 9.5  --    Liver Function Tests: Recent Labs  Lab 11/06/20 1214  AST 21  ALT 17  ALKPHOS 77  BILITOT 0.4  PROT 7.9  ALBUMIN 3.8    No results for input(s): LIPASE, AMYLASE in the last 168 hours. No results for input(s): AMMONIA in the last 168 hours. CBC: Recent Labs  Lab 11/06/20 1214 11/06/20 1234  WBC 10.0  --   NEUTROABS 6.4  --   HGB 13.0 14.3  HCT 42.1 42.0  MCV 86.8  --   PLT 362  --    Cardiac Enzymes: No results for input(s): CKTOTAL, CKMB, CKMBINDEX, TROPONINI in the last 168 hours. BNP: Invalid input(s): POCBNP CBG: Recent Labs  Lab 11/06/20 1225  GLUCAP 73   D-Dimer No results for input(s): DDIMER in the last 72 hours. Hgb A1c Recent Labs    11/06/20 1441  HGBA1C 5.9*   Lipid Profile Recent Labs    11/07/20 0433  CHOL 170  HDL 50  LDLCALC 98  TRIG 112  CHOLHDL 3.4   Thyroid function studies Recent Labs    11/06/20 1441  TSH 1.594   Anemia work up Recent Labs    11/06/20 1214  VITAMINB12 242   Urinalysis    Component Value Date/Time   COLORURINE YELLOW 11/06/2020 1418   APPEARANCEUR HAZY (A) 11/06/2020 1418   LABSPEC 1.026 11/06/2020 1418   PHURINE 6.0 11/06/2020 1418   GLUCOSEU NEGATIVE 11/06/2020 1418   HGBUR NEGATIVE 11/06/2020 1418   BILIRUBINUR NEGATIVE 11/06/2020 1418   KETONESUR NEGATIVE 11/06/2020 1418   PROTEINUR 30 (A) 11/06/2020 1418   UROBILINOGEN 0.2 02/06/2015 1125   NITRITE NEGATIVE 11/06/2020 1418   LEUKOCYTESUR NEGATIVE 11/06/2020 1418   Sepsis Labs Invalid input(s): PROCALCITONIN,  WBC,  LACTICIDVEN Microbiology Recent Results (from the past 240 hour(s))  Resp Panel by RT-PCR (Flu A&B, Covid) Nasopharyngeal Swab     Status: None   Collection Time: 11/06/20 12:14 PM   Specimen: Nasopharyngeal Swab; Nasopharyngeal(NP) swabs in vial transport medium  Result Value Ref Range Status   SARS Coronavirus 2 by RT PCR NEGATIVE NEGATIVE Final    Comment: (NOTE) SARS-CoV-2 target nucleic acids are NOT DETECTED.  The SARS-CoV-2 RNA is generally detectable in upper respiratory specimens during the acute phase of infection. The  lowest concentration of SARS-CoV-2 viral copies this assay can detect is 138 copies/mL. A negative result does not preclude SARS-Cov-2 infection and should not be used as the sole basis for treatment or other patient management decisions. A negative result may occur with  improper specimen collection/handling, submission of specimen other than nasopharyngeal swab, presence of viral mutation(s) within the areas targeted by this assay, and inadequate number of viral copies(<138 copies/mL). A negative result must be combined with clinical observations, patient history, and epidemiological information. The expected result is Negative.  Fact Sheet for Patients:  BloggerCourse.com  Fact Sheet for Healthcare Providers:  SeriousBroker.it  This test is no t yet approved or cleared by the [place] FDA and  has been authorized for detection and/or diagnosis of SARS-CoV-2 by FDA under an Emergency Use Authorization (EUA). This EUA will remain  in effect (meaning this test can be used) for the duration of the COVID-19 declaration under Section 564(b)(1) of the Act, 21 U.S.C.section 360bbb-3(b)(1), unless the authorization is terminated  or revoked sooner.       Influenza A by PCR NEGATIVE NEGATIVE Final  Influenza B by PCR NEGATIVE NEGATIVE Final    Comment: (NOTE) The Xpert Xpress SARS-CoV-2/FLU/RSV plus assay is intended as an aid in the diagnosis of influenza from Nasopharyngeal swab specimens and should not be used as a sole basis for treatment. Nasal washings and aspirates are unacceptable for Xpert Xpress SARS-CoV-2/FLU/RSV testing.  Fact Sheet for Patients: BloggerCourse.comhttps://www.fda.gov/media/152166/download  Fact Sheet for Healthcare Providers: SeriousBroker.ithttps://www.fda.gov/media/152162/download  This test is not yet approved or cleared by the [place] FDA and has been authorized for detection and/or diagnosis of SARS-CoV-2 by FDA under  an Emergency Use Authorization (EUA). This EUA will remain in effect (meaning this test can be used) for the duration of the COVID-19 declaration under Section 564(b)(1) of the Act, 21 U.S.C. section 360bbb-3(b)(1), unless the authorization is terminated or revoked.  Performed at [facility], [address] [place], [place] [address]    Time coordinating discharge: 35 mins  SIGNED:  Patient seen and examined with Dorothyann GibbsBailey Sanford, Medical student. In addition to supervising the encounter, I played a key role in the decision making process as well as reviewed key findings.  Standley Dakinslanford Jonell Krontz, MD   [facility] 11/07/2020, 3:01 PM How to contact the [facility] Attending or Consulting provider 7A - 7P or covering provider during after hours 7P -7A, for this patient?  1. Check the care team in [facility] and look for a) attending/consulting TR[facility] provider listed and b) the [facility] team listed 2. Log into [email or web address] and use [facility]'s universal password to access. If you do not have the password, please contact the hospital operator. 3. Locate the [facility] provider you are looking for under [facility] and page to a number that you can be directly reached. 4. If you still have difficulty reaching the provider, please page the [facility] (Director on Call) for the Hospitalists listed on amion for assistance.   If 7PM-7AM, please contact night-coverage [email or web address]

## 2021-01-13 DIAGNOSIS — R200 Anesthesia of skin: Secondary | ICD-10-CM | POA: Insufficient documentation

## 2021-01-13 DIAGNOSIS — Z8711 Personal history of peptic ulcer disease: Secondary | ICD-10-CM | POA: Insufficient documentation

## 2021-01-13 DIAGNOSIS — N200 Calculus of kidney: Secondary | ICD-10-CM | POA: Insufficient documentation

## 2021-01-13 DIAGNOSIS — I10 Essential (primary) hypertension: Secondary | ICD-10-CM | POA: Insufficient documentation

## 2021-03-09 NOTE — Progress Notes (Signed)
Sleep Medicine   Office Visit  Patient Name: Marissa Simpson DOB: [birth year] MRN [id number]    Chief Complaint:   Brief History:  Marissa Simpson presents for an initial sleep consultation. Patient reports a  history of daytime somnolence and snoring.  Sleep quality is good. This is noted most nights. The patient's bed partner reports  snoring at night. The patient relates the following symptoms: some snoring, some unrefreshed sleep are also present. The patient goes to sleep at 9-11pm and wakes up at 5:30-6am.  she reports that her sleep quality is good.   Patient has noted movement  of her legs at night.  She feels that she likely has restless legs due to the needing to move them through the night. The patient  relates no unusual behavior during the night.  The patient reports a history of psychiatric problems with anxiety. The Epworth Sleepiness Score is 9 out of 24 .  The patient relates  Cardiovascular risk factors include: hypertension, TIA.      ROS  General: (-) fever, (-) chills, (-) night sweat Nose and Sinuses: (-) nasal stuffiness or itchiness, (-) postnasal drip, (-) nosebleeds, (-) sinus trouble. Mouth and Throat: (-) sore throat, (-) hoarseness. Neck: (-) swollen glands, (-) enlarged thyroid, (-) neck pain. Respiratory: - cough, - shortness of breath, - wheezing. Neurologic: -- numbness, - tingling. Psychiatric: + anxiety, - depression Sleep behavior: -sleep paralysis -hypnogogic hallucinations -dream enactment      -vivid dreams -cataplexy -night terrors -sleep walking   Current Medication: Outpatient Encounter Medications as of 03/10/2021  Medication Sig   acetaminophen (TYLENOL) 500 MG tablet Take 1,000 mg by mouth every 4 (four) hours as needed for moderate pain.   amLODipine (NORVASC) 2.5 MG tablet Take 2.5 mg by mouth daily.   atorvastatin (LIPITOR) 20 MG tablet Take 1 tablet (20 mg total) by mouth every evening.   calcium carbonate (TUMS - DOSED IN MG ELEMENTAL  CALCIUM) 500 MG chewable tablet Chew 1 tablet by mouth daily as needed for indigestion or heartburn.   Calcium Carbonate-Simethicone 750-80 MG CHEW Chew 1 tablet by mouth daily as needed (heartburn).   clonazePAM (KLONOPIN) 0.5 MG tablet Take 0.25 mg by mouth daily as needed for anxiety.   cyclobenzaprine (FLEXERIL) 10 MG tablet Take 1 tablet (10 mg total) by mouth at bedtime as needed for muscle spasms.   hydrochlorothiazide (MICROZIDE) 12.5 MG capsule Take 12.5 mg by mouth daily.   pseudoephedrine-acetaminophen (TYLENOL SINUS) 30-500 MG TABS tablet Take 1 tablet by mouth every 4 (four) hours as needed.   traMADol (ULTRAM) 50 MG tablet Take 1 tablet (50 mg total) by mouth every 6 (six) hours as needed for moderate pain.   Vitamin D, Ergocalciferol, (DRISDOL) 1.25 MG (50000 UNIT) CAPS capsule Take 1 capsule (50,000 Units total) by mouth every 7 (seven) days.   No facility-administered encounter medications on file as of 03/10/2021.    Surgical History: Past Surgical History:  Procedure Laterality Date   CARPAL TUNNEL RELEASE     TUBAL LIGATION      Medical History: Past Medical History:  Diagnosis Date   Carpal tunnel syndrome    History of stomach ulcers    Hypertension    Kidney stones kidney stones   Kidney stones kidney stones    Family History: Non contributory to the present illness  Social History: Social History   Socioeconomic History   Marital status: Legally Separated    Spouse name: Not on file   Number of  children: Not on file   Years of education: Not on file   Highest education level: Not on file  Occupational History   Not on file  Tobacco Use   Smoking status: Never   Smokeless tobacco: Never  Substance and Sexual Activity   Alcohol use: No   Drug use: No   Sexual activity: Yes    Birth control/protection: Surgical  Other Topics Concern   Not on file  Social History Narrative   Not on file   Social Determinants of Health   Financial Resource  Strain: Not on file  Food Insecurity: Not on file  Transportation Needs: Not on file  Physical Activity: Not on file  Stress: Not on file  Social Connections: Not on file  Intimate Partner Violence: Not on file    Vital Signs: There were no vitals taken for this visit.  Examination: General Appearance: The patient is well-developed, well-nourished, and in no distress. Neck Circumference: - Skin: Gross inspection of skin unremarkable. Head: normocephalic, no gross deformities. Eyes: no gross deformities noted. ENT: ears appear grossly normal Neurologic: Alert and oriented. No involuntary movements.    EPWORTH SLEEPINESS SCALE:  Scale:  (0)= no chance of dozing; (1)= slight chance of dozing; (2)= moderate chance of dozing; (3)= high chance of dozing  Chance  Situtation    Sitting and reading: 2    Watching TV: 1    Sitting Inactive in public: 0    As a passenger in car: 2      Lying down to rest: 1    Sitting and talking: 0    Sitting quielty after lunch: 1    In a car, stopped in traffic: 0   TOTAL SCORE:   7 out of 24     LABS: No results found for this or any previous visit (from the past 2160 hour(s)).  Radiology: MR ANGIO HEAD WO CONTRAST  Result Date: 11/07/2020 CLINICAL DATA:  TIA.  Headache and dizziness.  Left-sided numbness. EXAM: MRI HEAD WITHOUT CONTRAST MRA HEAD WITHOUT CONTRAST TECHNIQUE: Multiplanar, multiecho pulse sequences of the brain and surrounding structures were obtained without intravenous contrast. Angiographic images of the head were obtained using MRA technique without contrast. COMPARISON:  CT head 11/06/2020 FINDINGS: MRI HEAD FINDINGS Brain: No acute infarction, hemorrhage, hydrocephalus, extra-axial collection or mass lesion. Normal cerebral white matter. Negative for demyelinating disease. Vascular: Normal arterial flow voids. Skull and upper cervical spine: Negative Sinuses/Orbits: Paranasal sinuses clear.  Negative orbit Other:  None MRA HEAD FINDINGS Internal carotid artery normal bilaterally. Anterior and middle cerebral arteries normal bilaterally. Normal posterior circulation without stenosis or occlusion. Fetal origin right posterior cerebral artery. Left posterior communicating artery patent. IMPRESSION: Normal MRA head Normal MRI head. Electronically Signed   By: Marlan Palau M.D.   On: 11/07/2020 12:48   MR BRAIN WO CONTRAST  Result Date: 11/07/2020 CLINICAL DATA:  TIA.  Headache and dizziness.  Left-sided numbness. EXAM: MRI HEAD WITHOUT CONTRAST MRA HEAD WITHOUT CONTRAST TECHNIQUE: Multiplanar, multiecho pulse sequences of the brain and surrounding structures were obtained without intravenous contrast. Angiographic images of the head were obtained using MRA technique without contrast. COMPARISON:  CT head 11/06/2020 FINDINGS: MRI HEAD FINDINGS Brain: No acute infarction, hemorrhage, hydrocephalus, extra-axial collection or mass lesion. Normal cerebral white matter. Negative for demyelinating disease. Vascular: Normal arterial flow voids. Skull and upper cervical spine: Negative Sinuses/Orbits: Paranasal sinuses clear.  Negative orbit Other: None MRA HEAD FINDINGS Internal carotid artery normal bilaterally. Anterior and middle cerebral  arteries normal bilaterally. Normal posterior circulation without stenosis or occlusion. Fetal origin right posterior cerebral artery. Left posterior communicating artery patent. IMPRESSION: Normal MRA head Normal MRI head. Electronically Signed   By: Marlan Palauharles  Clark M.D.   On: 11/07/2020 12:48   [facility])  Result Date: 11/07/2020 CLINICAL DATA:  TIA Left-sided body numbness EXAM: BILATERAL CAROTID DUPLEX ULTRASOUND TECHNIQUE: Wallace CullensGray scale imaging, color Doppler and duplex ultrasound were performed of bilateral carotid and vertebral arteries in the neck. COMPARISON:  None. FINDINGS: Criteria: Quantification of carotid stenosis is based on velocity parameters  that correlate the residual internal carotid diameter with NASCET-based stenosis levels, using the diameter of the distal internal carotid lumen as the denominator for stenosis measurement. The following velocity measurements were obtained: RIGHT ICA: 110/46 cm/sec CCA: 80/18 cm/sec SYSTOLIC ICA/CCA RATIO:  89 ECA: 1.4 cm/sec LEFT ICA: 61/14 cm/sec CCA: 105/25 cm/sec SYSTOLIC ICA/CCA RATIO:  0.6 ECA: 85 cm/sec RIGHT CAROTID ARTERY: Elevated end-diastolic velocity likely artifact given that there is no significant atherosclerotic disease of the internal carotid artery. RIGHT VERTEBRAL ARTERY:  Antegrade flow. LEFT CAROTID ARTERY: No significant atherosclerotic disease. No significant stenosis. LEFT VERTEBRAL ARTERY:  Antegrade flow. IMPRESSION: No significant narrowing of internal carotid arteries. Electronically Signed   By: Acquanetta BellingFarhaan  Mir M.D.   On: 11/07/2020 11:12   ECHOCARDIOGRAM COMPLETE BUBBLE STUDY  Result Date: 11/07/2020    ECHOCARDIOGRAM REPORT   [facility] Date of Exam: 11/07/2020 Medical Rec #:  [id number]         Height:       63.0 in Accession #:    [id number]        Weight:       262.0 lb Date of Birth:  [birth year]         BSA:          2.169 m Patient Age:    44 years          BP:           105/60 mmHg Patient Gender: F                 HR:           83 bpm. [facility] Procedure: 2D Echo and Saline Contrast Bubble Study Indications:    TIA (transient ischemic attack) 435.9 / G45.9  History:        Patient has no prior history of Echocardiogram examinations.                 Risk Factors:Non-Smoker and Hypertension. Prediabetes, Facial                 Numbness.  Sonographer:    Jeryl ColumbiaJohanna Elliott RDCS (AE) Referring Phys: [id number] CLANFORD L JOHNSON IMPRESSIONS  1. Inferior basal hypokinesis . Left ventricular ejection fraction, by estimation, is 50 to 55%. The left ventricle has low normal function. The left ventricle has no regional wall motion abnormalities. There is  mild left ventricular hypertrophy. Left ventricular diastolic parameters were normal.  2. Right ventricular systolic function is normal. The right ventricular size is normal.  3. Bubble study negative for right to left shunt.  4. The mitral valve is normal in structure. Trivial mitral valve regurgitation. No evidence of mitral stenosis.  5. The aortic valve is normal in structure. Aortic valve regurgitation is not visualized. No aortic stenosis is present.  6. The inferior vena cava is normal in  size with greater than 50% respiratory variability, suggesting right atrial pressure of 3 mmHg. FINDINGS  Left Ventricle: Inferior basal hypokinesis. Left ventricular ejection fraction, by estimation, is 50 to 55%. The left ventricle has low normal function. The left ventricle has no regional wall motion abnormalities. The left ventricular internal cavity size was normal in size. There is mild left ventricular hypertrophy. Left ventricular diastolic parameters were normal. Right Ventricle: The right ventricular size is normal. No increase in right ventricular wall thickness. Right ventricular systolic function is normal. Left Atrium: Left atrial size was normal in size. Right Atrium: Right atrial size was normal in size. Pericardium: There is no evidence of pericardial effusion. Mitral Valve: The mitral valve is normal in structure. Trivial mitral valve regurgitation. No evidence of mitral valve stenosis. Tricuspid Valve: The tricuspid valve is normal in structure. Tricuspid valve regurgitation is trivial. No evidence of tricuspid stenosis. Aortic Valve: The aortic valve is normal in structure. Aortic valve regurgitation is not visualized. No aortic stenosis is present. Pulmonic Valve: The pulmonic valve was normal in structure. Pulmonic valve regurgitation is not visualized. No evidence of pulmonic stenosis. Aorta: The aortic root is normal in size and structure. Venous: The inferior vena cava is normal in size with greater  than 50% respiratory variability, suggesting right atrial pressure of 3 mmHg. IAS/Shunts: No atrial level shunt detected by color flow Doppler. Agitated saline contrast was given intravenously to evaluate for intracardiac shunting.  LEFT VENTRICLE PLAX 2D LVIDd:         3.28 cm  Diastology LVIDs:         2.62 cm  LV e' medial:    6.42 cm/s LV PW:         1.01 cm  LV E/e' medial:  13.3 LV IVS:        1.24 cm  LV e' lateral:   7.40 cm/s LVOT diam:     2.00 cm  LV E/e' lateral: 11.6 LVOT Area:     3.14 cm  RIGHT VENTRICLE RV S prime:     12.60 cm/s TAPSE (M-mode): 2.0 cm LEFT ATRIUM             Index       RIGHT ATRIUM          Index LA diam:        3.10 cm 1.43 cm/m  RA Area:     7.20 cm LA Vol (A2C):   45.9 ml 21.16 ml/m RA Volume:   10.20 ml 4.70 ml/m LA Vol (A4C):   42.6 ml 19.64 ml/m LA Biplane Vol: 47.0 ml 21.67 ml/m   AORTA Ao Root diam: 2.60 cm MITRAL VALVE MV Area (PHT): 3.87 cm    SHUNTS MV Decel Time: 196 msec    Systemic Diam: 2.00 cm MV E velocity: 85.70 cm/s MV A velocity: 80.60 cm/s MV E/A ratio:  1.06 Charlton Haws MD Electronically signed by Charlton Haws MD Signature Date/Time: 11/07/2020/9:51:32 AM    Final    CT HEAD CODE STROKE WO CONTRAST  Result Date: 11/06/2020 CLINICAL DATA:  Code stroke.  Dizziness and left-sided numbness. EXAM: CT HEAD WITHOUT CONTRAST TECHNIQUE: Contiguous axial images were obtained from the base of the skull through the vertex without intravenous contrast. COMPARISON:  12/14/2008 FINDINGS: Brain: There is no evidence of an acute infarct, intracranial hemorrhage, mass, midline shift, or extra-axial fluid collection. The ventricles and sulci are normal. There is a chronic partially empty sella. Vascular: No hyperdense vessel. Skull: No fracture or  suspicious osseous lesion. Sinuses/Orbits: Visualized paranasal sinuses and mastoid air cells are clear. Unremarkable orbits. Other: None. ASPECTS [facility] Stroke Program Early CT Score) - Ganglionic level infarction  (caudate, lentiform nuclei, internal capsule, insula, M1-M3 cortex): 7 - Supraganglionic infarction (M4-M6 cortex): 3 Total score (0-10 with 10 being normal): 10 IMPRESSION: No evidence of acute intracranial abnormality.  ASPECTS of 10. These results were called by telephone at the time of interpretation on 11/06/2020 at 12:32 pm to Dr. Coralee Pesa, who verbally acknowledged these results. Electronically Signed   By: Sebastian Ache M.D.   On: 11/06/2020 12:32    No results found.  No results found.    Assessment and Plan: Patient Active Problem List   Diagnosis Date Noted   Prediabetes 11/07/2020   Vitamin D deficiency 11/07/2020   TIA (transient ischemic attack) 11/06/2020   Hypertension    History of stomach ulcers    Kidney stones    Facial numbness   1. OSA (obstructive sleep apnea) PLAN OSA:   Patient evaluation suggests high risk of sleep disordered breathing due to morbid obesity, snoring, daytime somnolence, h/o TIA Patient has comorbid cardiovascular risk factors including: hypertension which could be exacerbated by pathologic sleep-disordered breathing.  Suggest: PSG to assess the patient's sleep disordered breathing. The patient was also counselled on weight loss to optimize sleep health.  OSA- get PSG to evaluate.    2. Primary hypertension Pt will continue with amlodipine and hctz.  Hypertension Counseling:   The following hypertensive lifestyle modification were recommended and discussed:  1. Limiting alcohol intake to less than 1 oz/day of ethanol:(24 oz of beer or 8 oz of wine or 2 oz of 100-proof whiskey). 2. Take baby ASA 81 mg daily. 3. Importance of regular aerobic exercise and losing weight. 4. Reduce dietary saturated fat and cholesterol intake for overall cardiovascular health. 5. Maintaining adequate dietary potassium, calcium, and magnesium intake. 6. Regular monitoring of the blood pressure. 7. Reduce sodium intake to less than 100 mmol/day (less than  2.3 gm of sodium or less than 6 gm of sodium choride)    3. Morbid obesity (HCC) Obesity Counseling: Had a lengthy discussion regarding patients BMI and weight issues. Patient was instructed on portion control as well as increased activity. Also discussed caloric restrictions with trying to maintain intake less than 2000 Kcal. Discussions were made in accordance with the 5As of weight management. Simple actions such as not eating late and if able to, taking a walk is suggested.   4. Restless leg syndrome Will assess in psg, f/u after set up       General Counseling: I have discussed the findings of the evaluation and examination with [place].  I have also discussed any further diagnostic evaluation thatmay be needed or ordered today. [place] verbalizes understanding of the findings of todays visit. We also reviewed her medications today and discussed drug interactions and side effects including but not limited excessive drowsiness and altered mental states. We also discussed that there is always a risk not just to her but also people around her. she has been encouraged to call the office with any questions or concerns that should arise related to todays visit.  No orders of the defined types were placed in this encounter.       I have personally obtained a history, evaluated the patient, evaluated pertinent data, formulated the assessment and plan and placed orders.   This patient was seen today by Emmaline Kluver, PA-C in collaboration with Dr.  Freda Munro.    Yevonne Pax, MD [facility] Diplomate ABMS Pulmonary and Critical Care Medicine Sleep medicine

## 2021-03-10 ENCOUNTER — Ambulatory Visit (INDEPENDENT_AMBULATORY_CARE_PROVIDER_SITE_OTHER): Payer: Commercial Managed Care - PPO | Admitting: Internal Medicine

## 2021-03-10 VITALS — Ht 63.0 in | Wt 256.0 lb

## 2021-03-10 DIAGNOSIS — I10 Essential (primary) hypertension: Secondary | ICD-10-CM | POA: Diagnosis not present

## 2021-03-10 DIAGNOSIS — G2581 Restless legs syndrome: Secondary | ICD-10-CM

## 2021-03-10 DIAGNOSIS — G4733 Obstructive sleep apnea (adult) (pediatric): Secondary | ICD-10-CM | POA: Diagnosis not present

## 2021-11-22 HISTORY — PX: INCISION AND DRAINAGE: SHX5863

## 2022-02-14 IMAGING — MR MR HEAD W/O CM
11 of 13 series · 28 of 48 positions shown · non-contrast
Comparison: CT head 11/06/2020

CLINICAL DATA: TIA.  Headache and dizziness.  Left-sided numbness.

EXAM:
MRI HEAD WITHOUT CONTRAST
MRA HEAD WITHOUT CONTRAST
TECHNIQUE: Multiplanar, multiecho pulse sequences of the brain and surrounding
structures were obtained without intravenous contrast. Angiographic
images of the head were obtained using MRA technique without
contrast.

[Series 5: DWI · axial · 4.0mm · 0.88mm/px · z∈[-70,+69]mm · 4 of 36 slices shown (1 of 6)]
[im 1/36]
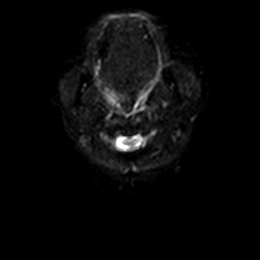
[im 12/36]
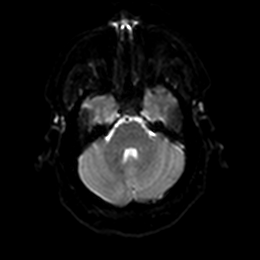
[im 24/36]
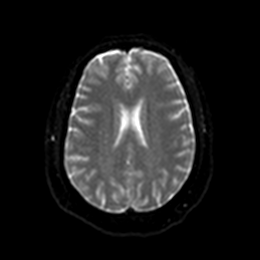
[im 36/36]
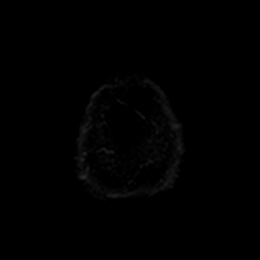

[Series 5: DWI · axial · 4.0mm · 0.88mm/px · z∈[-70,+69]mm · 4 of 36 slices shown (2 of 6)]
[im 1/36]
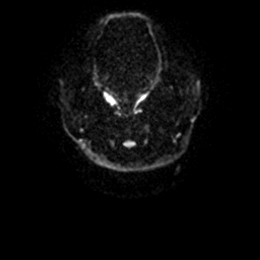
[im 12/36]
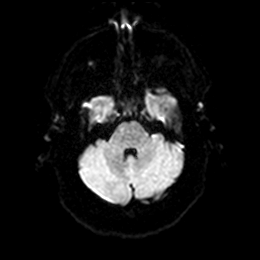
[im 24/36]
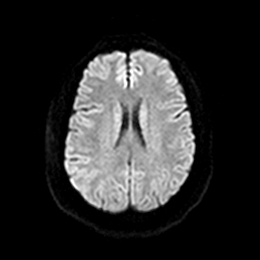
[im 36/36]
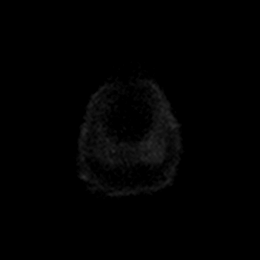

[Series 6: DWI · axial · 4.0mm · 0.88mm/px · z∈[-70,+69]mm · 3 of 36 slices shown (3 of 6)]
[im 1/36]
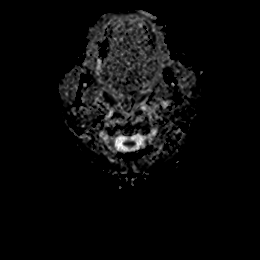
[im 18/36]
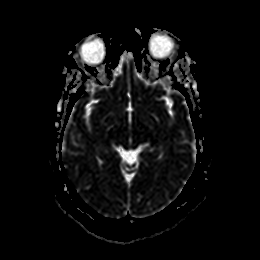
[im 36/36]
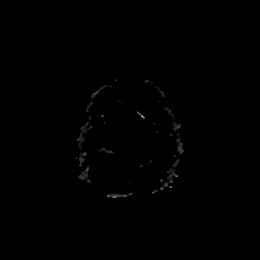

[Series 7: DWI · coronal · 5.0mm · 0.88mm/px · 2 of 28 slices shown (4 of 6)]
[im 1/28]
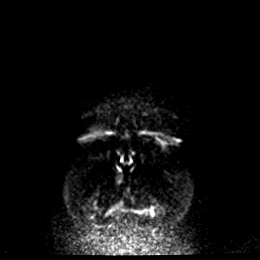
[im 28/28]
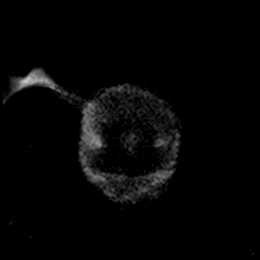

[Series 7: DWI · coronal · 5.0mm · 0.88mm/px · 2 of 28 slices shown (5 of 6)]
[im 1/28]
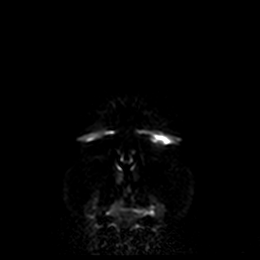
[im 28/28]
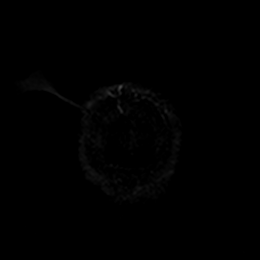

[Series 8: DWI · coronal · 5.0mm · 0.88mm/px · 2 of 28 slices shown (6 of 6)]
[im 1/28]
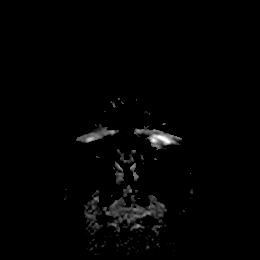
[im 28/28]
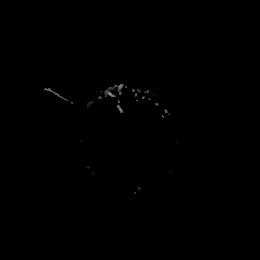

[Series 9: T1 · sagittal · 5.0mm · 0.94mm/px · 2 of 19 slices shown]
[im 1/19]
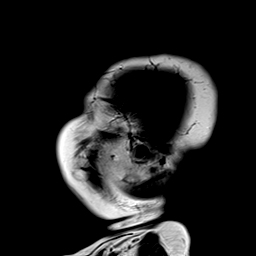
[im 19/19]
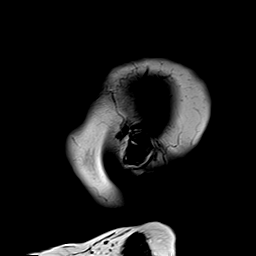

[Series 15: T2 · axial · 5.0mm · 0.72mm/px · z∈[-67,+66]mm · 2 of 20 slices shown (1 of 2)]
[im 1/20]
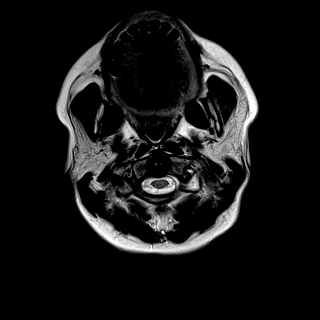
[im 20/20]
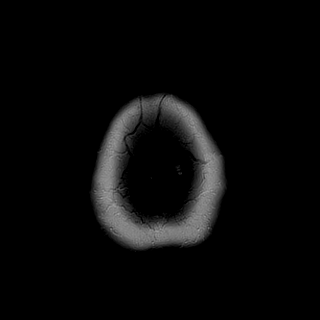

[Series 16: ax hemo · axial · 5.0mm · 0.86mm/px · z∈[-71,+72]mm · 2 of 25 slices shown]
[im 1/25]
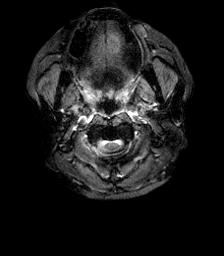
[im 25/25]
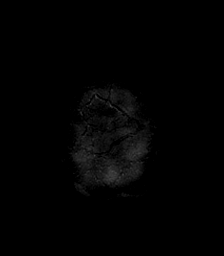

[Series 17: FLAIR · axial · 4.0mm · 0.43mm/px · z∈[-61,+62]mm · 3 of 32 slices shown]
[im 1/32]
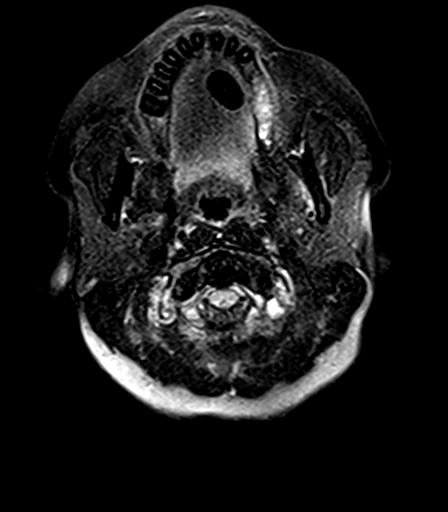
[im 16/32]
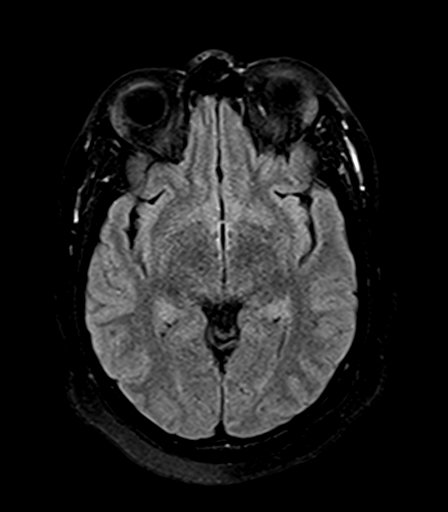
[im 32/32]
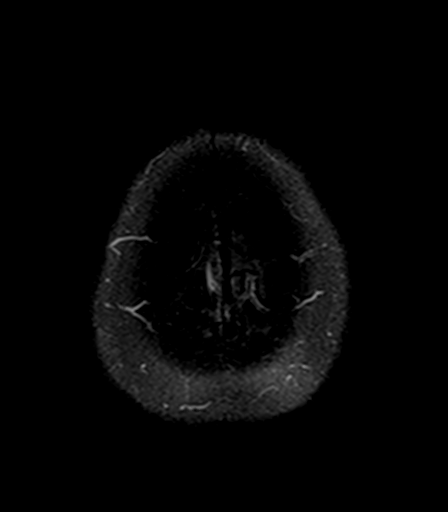

[Series 19: T2 · coronal · 5.0mm · 0.72mm/px · 2 of 24 slices shown (2 of 2)]
[im 1/24]
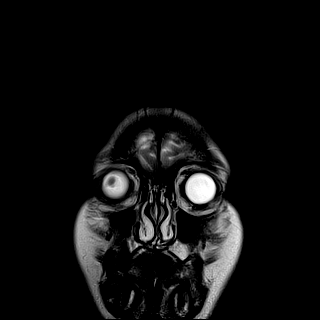
[im 24/24]
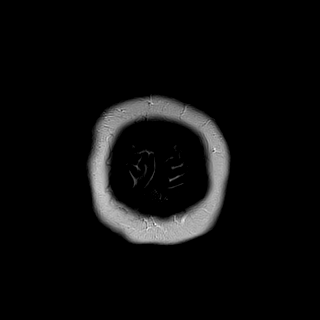

[28 of 48 positions shown; findings below may reference images not displayed]

FINDINGS: MRI HEAD FINDINGS

Brain: No acute infarction, hemorrhage, hydrocephalus, extra-axial
collection or mass lesion. Normal cerebral white matter. Negative
for demyelinating disease.

Vascular: Normal arterial flow voids.

Skull and upper cervical spine: Negative

Sinuses/Orbits: Paranasal sinuses clear.  Negative orbit

Other: None

MRA HEAD FINDINGS

Internal carotid artery normal bilaterally. Anterior and middle
cerebral arteries normal bilaterally.

Normal posterior circulation without stenosis or occlusion. Fetal
origin right posterior cerebral artery. Left posterior communicating
artery patent.
IMPRESSION: Normal MRA head

Normal MRI head.

## 2022-03-02 ENCOUNTER — Ambulatory Visit: Payer: Commercial Managed Care - PPO | Admitting: Plastic Surgery

## 2022-03-02 ENCOUNTER — Encounter: Payer: Self-pay | Admitting: Plastic Surgery

## 2022-03-02 VITALS — BP 111/77 | HR 90 | Ht 63.0 in | Wt 190.0 lb

## 2022-03-02 DIAGNOSIS — M542 Cervicalgia: Secondary | ICD-10-CM | POA: Diagnosis not present

## 2022-03-02 DIAGNOSIS — Z6833 Body mass index (BMI) 33.0-33.9, adult: Secondary | ICD-10-CM

## 2022-03-02 DIAGNOSIS — M546 Pain in thoracic spine: Secondary | ICD-10-CM | POA: Diagnosis not present

## 2022-03-02 DIAGNOSIS — N62 Hypertrophy of breast: Secondary | ICD-10-CM

## 2022-03-02 DIAGNOSIS — M545 Low back pain, unspecified: Secondary | ICD-10-CM | POA: Diagnosis not present

## 2022-03-02 DIAGNOSIS — G8929 Other chronic pain: Secondary | ICD-10-CM

## 2022-03-03 NOTE — Progress Notes (Signed)
Referring Provider Ignatius Specking, MD [address] [place],  [place] [address]   CC:  Breast hypertrophy and back pain   Marissa Simpson is an 46 y.o. female.  HPI:   The patient is a 46 y.o. female with a history of mammary hyperplasia for several years.  She has extremely large breasts causing symptoms that include the following: Back pain in the upper and lower back, including neck pain. She pulls or pins her bra straps to provide better lift and relief of the pressure and pain. She notices relief by holding her breast up manually.  Her shoulder straps cause grooves and pain and pressure that requires padding for relief. Pain medication is sometimes required with motrin and tylenol.  Activities that are hindered by enlarged breasts include: exercise and running.  She has tried supportive clothing as well as fitted bras without improvement.     Mammogram history: January 2023 normal.  Family history of breast cancer: None.  Tobacco use: None.   The patient expresses the desire to pursue surgical intervention.  Patient has a history of being prediabetic but her hemoglobin A1c was just above 5 most recently.  Patient has participated in weight loss clinic.  The BMI = 33.  Preoperative bra size = H cup.   Allergies  Allergen Reactions   Aspirin     Patient can't take due to stomach ulcers.   Darvocet [Propoxyphene N-Acetaminophen] Itching   Ibuprofen     Patient can't take due to stomach ulcers    Outpatient Encounter Medications as of 03/02/2022  Medication Sig   acetaminophen (TYLENOL) 650 MG CR tablet Take by mouth as needed.   amLODipine (NORVASC) 2.5 MG tablet Take 2.5 mg by mouth daily.   atorvastatin (LIPITOR) 20 MG tablet Take 1 tablet (20 mg total) by mouth every evening.   baclofen (LIORESAL) 10 MG tablet Take by mouth as needed.   calcium carbonate (TUMS - DOSED IN MG ELEMENTAL CALCIUM) 500 MG chewable tablet Chew 1 tablet by mouth daily as needed for indigestion or heartburn.    Calcium Carbonate-Simethicone 750-80 MG CHEW Chew 1 tablet by mouth daily as needed (heartburn).   clonazePAM (KLONOPIN) 0.5 MG tablet Take 0.25 mg by mouth as needed for anxiety.   cyclobenzaprine (FLEXERIL) 10 MG tablet Take 1 tablet (10 mg total) by mouth at bedtime as needed for muscle spasms.   FLUoxetine (PROZAC) 10 MG capsule Take 1 capsule by mouth daily.   hydrochlorothiazide (MICROZIDE) 12.5 MG capsule Take 12.5 mg by mouth daily.   metFORMIN (GLUCOPHAGE) 500 MG tablet Take by mouth.   nystatin (MYCOSTATIN/NYSTOP) powder Apply to affected area 3 times daily   omeprazole (PRILOSEC) 40 MG capsule Take 1 capsule by mouth daily.   promethazine (PHENERGAN) 25 MG tablet Take 25 mg by mouth every 8 (eight) hours as needed.   pseudoephedrine-acetaminophen (TYLENOL SINUS) 30-500 MG TABS tablet Take 1 tablet by mouth every 4 (four) hours as needed.   Semaglutide, 2 MG/DOSE, (OZEMPIC, 2 MG/DOSE,) 8 MG/3ML SOPN Inject into the skin.   traMADol (ULTRAM) 50 MG tablet Take 1 tablet (50 mg total) by mouth every 6 (six) hours as needed for moderate pain.   triamcinolone ointment (KENALOG) 0.1 % Apply topically as needed.   Vitamin D, Ergocalciferol, (DRISDOL) 1.25 MG (50000 UNIT) CAPS capsule Take 1 capsule (50,000 Units total) by mouth every 7 (seven) days.   [DISCONTINUED] acetaminophen (TYLENOL) 500 MG tablet Take 1,000 mg by mouth every 4 (four) hours  as needed for moderate pain. (Patient not taking: Reported on 03/02/2022)   No facility-administered encounter medications on file as of 03/02/2022.     Past Medical History:  Diagnosis Date   Anxiety    Carpal tunnel syndrome    History of stomach ulcers    Hypertension    Kidney stones kidney stones   Kidney stones kidney stones    Past Surgical History:  Procedure Laterality Date   CARPAL TUNNEL RELEASE     TUBAL LIGATION      No family history on file.  Social History   Social History Narrative   Not on file     Review of  Systems General: Denies fevers, chills, weight loss CV: Denies chest pain, shortness of breath, palpitations   Physical Exam    03/02/2022   11:26 AM 03/10/2021   11:47 AM 11/07/2020    3:13 PM  Vitals with BMI  Height 5\' 3"  5\' 3"    Weight 190 lbs 256 lbs   BMI 33.67 45.36   Systolic 111  114  Diastolic 77  60  Pulse 90  81    General:  No acute distress,  Alert and oriented, Non-Toxic, Normal speech and affect Breast: No easily palpable breast masses on physical exam, significant breast ptosis and macromastia. Her breasts are extremely large and fairly symmetric with the right significantly larger.  She has hyperpigmentation of the inframammary area on both sides.  The sternal to nipple distance on the right is 43 cm and the left is 39 cm.  The IMF distance is 17 cm on the right and 16 cm on the left.  Base width is 19 on the right and 18 on the left. Assessment/Plan   The patient has bilateral symptomatic macromastia.  She is a good candidate for a breast reduction.  She is interested in pursuing surgical treatment.  She has tried supportive garments and fitted bras with no relief.  The details of breast reduction surgery were discussed.  I explained the procedure in detail along the with the expected scars.  The risks were discussed in detail and include bleeding, infection, damage to surrounding structures, need for additional procedures, nipple loss, change in nipple sensation, persistent pain, contour irregularities and asymmetries.  I explained that breast feeding is often not possible after breast reduction surgery.  We discussed the expected postoperative course with an overall recovery period of about 1 month.  She demonstrated full understanding of all risks.  We discussed her personal risk factors that include high BMI.  The patient is interested in pursuing surgical treatment.  The estimated excess breast tissue to be removed at the time of surgery = 650 grams on the left and 650  grams on the right. 03/03/2022, 9:00 PM

## 2022-03-07 ENCOUNTER — Telehealth: Payer: Self-pay | Admitting: Plastic Surgery

## 2022-03-07 NOTE — Telephone Encounter (Signed)
Prior authorization submitted via [employer] portal. Case id: [id number]. Case is pending at this time.

## 2022-03-22 ENCOUNTER — Telehealth: Payer: Self-pay | Admitting: Plastic Surgery

## 2022-03-22 NOTE — Telephone Encounter (Signed)
Tried to return patient's call back but unable to get through on her line. Will try to call her back later. Mychart not set up or I would also reach out that way

## 2022-03-26 ENCOUNTER — Telehealth: Payer: Self-pay | Admitting: Plastic Surgery

## 2022-03-26 NOTE — Telephone Encounter (Signed)
Tried to call patient at number listed on acct as well as the number left on message to me. Number on the account does not go through. Message left for me on my voicemail [phone] doesn't have a vm set up. Patient has been approved for surgery but I cannot get in touch with her. 2 attempts made

## 2022-04-04 ENCOUNTER — Telehealth: Payer: Self-pay

## 2022-04-04 NOTE — Telephone Encounter (Signed)
Returned patients call at [phone] per her request. LM with office staff for her to call back to schedule her surgery.

## 2022-04-16 NOTE — Progress Notes (Deleted)
Patient ID: Lacinda Axon, female    DOB: January 12, 1976, 46 y.o.   MRN: [id number]  No chief complaint on file.   No diagnosis found.   History of Present Illness: Marissa Simpson is a 46 y.o.  female  with a history of macromastia.  She presents for preoperative evaluation for upcoming procedure, bilateral breast reduction with possible free nipple graft, scheduled for 04/24/2022 with Dr.  Erin Hearing .  The patient {HAS HAS [id number] had problems with anesthesia. ***  Summary of Previous Visit: She was seen for consult by Dr. Erin Hearing on 03/02/2022.  At that time, complained of chronic upper back and neck discomfort in the context of large breasts.  BMI equals 33 kg/m.  Preoperative bra size equals H cup.  STN 43 cm on the right, 39 cm on the left.  Base width 19 cm on the right, 18 cm on the left.  Estimated excess breast tissue removed at time of surgery equals 650 g each side.  NO DISCUSSION ABOUT FREE NIPPLE?***  Job: ***  PMH Significant for: ***   Past Medical History: Allergies: Allergies  Allergen Reactions   Aspirin     Patient can't take due to stomach ulcers.   Darvocet [Propoxyphene N-Acetaminophen] Itching   Ibuprofen     Patient can't take due to stomach ulcers    Current Medications:  Current Outpatient Medications:    acetaminophen (TYLENOL) 650 MG CR tablet, Take by mouth as needed., Disp: , Rfl:    amLODipine (NORVASC) 2.5 MG tablet, Take 2.5 mg by mouth daily., Disp: , Rfl:    atorvastatin (LIPITOR) 20 MG tablet, Take 1 tablet (20 mg total) by mouth every evening., Disp: 30 tablet, Rfl: 1   baclofen (LIORESAL) 10 MG tablet, Take by mouth as needed., Disp: , Rfl:    calcium carbonate (TUMS - DOSED IN MG ELEMENTAL CALCIUM) 500 MG chewable tablet, Chew 1 tablet by mouth daily as needed for indigestion or heartburn., Disp: , Rfl:    Calcium Carbonate-Simethicone 750-80 MG CHEW, Chew 1 tablet by mouth daily as needed (heartburn)., Disp: , Rfl:    clonazePAM  (KLONOPIN) 0.5 MG tablet, Take 0.25 mg by mouth as needed for anxiety., Disp: , Rfl:    cyclobenzaprine (FLEXERIL) 10 MG tablet, Take 1 tablet (10 mg total) by mouth at bedtime as needed for muscle spasms., Disp: 20 tablet, Rfl: 0   FLUoxetine (PROZAC) 10 MG capsule, Take 1 capsule by mouth daily., Disp: , Rfl:    hydrochlorothiazide (MICROZIDE) 12.5 MG capsule, Take 12.5 mg by mouth daily., Disp: , Rfl:    metFORMIN (GLUCOPHAGE) 500 MG tablet, Take by mouth., Disp: , Rfl:    nystatin (MYCOSTATIN/NYSTOP) powder, Apply to affected area 3 times daily, Disp: , Rfl:    omeprazole (PRILOSEC) 40 MG capsule, Take 1 capsule by mouth daily., Disp: , Rfl:    promethazine (PHENERGAN) 25 MG tablet, Take 25 mg by mouth every 8 (eight) hours as needed., Disp: , Rfl:    pseudoephedrine-acetaminophen (TYLENOL SINUS) 30-500 MG TABS tablet, Take 1 tablet by mouth every 4 (four) hours as needed., Disp: , Rfl:    Semaglutide, 2 MG/DOSE, (OZEMPIC, 2 MG/DOSE,) 8 MG/3ML SOPN, Inject into the skin., Disp: , Rfl:    traMADol (ULTRAM) 50 MG tablet, Take 1 tablet (50 mg total) by mouth every 6 (six) hours as needed for moderate pain., Disp: 20 tablet, Rfl: 0   triamcinolone ointment (KENALOG) 0.1 %, Apply topically as needed., Disp: ,  Rfl:    Vitamin D, Ergocalciferol, (DRISDOL) 1.25 MG (50000 UNIT) CAPS capsule, Take 1 capsule (50,000 Units total) by mouth every 7 (seven) days., Disp: 5 capsule, Rfl: 0  Past Medical Problems: Past Medical History:  Diagnosis Date   Anxiety    Carpal tunnel syndrome    History of stomach ulcers    Hypertension    Kidney stones kidney stones   Kidney stones kidney stones    Past Surgical History: Past Surgical History:  Procedure Laterality Date   CARPAL TUNNEL RELEASE     TUBAL LIGATION      Social History: Social History   Socioeconomic History   Marital status: Legally Separated    Spouse name: Not on file   Number of children: Not on file   Years of education: Not on  file   Highest education level: Not on file  Occupational History   Not on file  Tobacco Use   Smoking status: Never   Smokeless tobacco: Never  Substance and Sexual Activity   Alcohol use: No   Drug use: No   Sexual activity: Yes    Birth control/protection: Surgical  Other Topics Concern   Not on file  Social History Narrative   Not on file   Social Determinants of Health   Financial Resource Strain: Not on file  Food Insecurity: Not on file  Transportation Needs: Not on file  Physical Activity: Not on file  Stress: Not on file  Social Connections: Not on file  Intimate Partner Violence: Not on file    Family History: No family history on file.  Review of Systems: ROS  Physical Exam: Vital Signs There were no vitals taken for this visit.  Physical Exam *** Constitutional:      General: Not in acute distress.    Appearance: Normal appearance. Not ill-appearing.  HENT:     Head: Normocephalic and atraumatic.  Eyes:     Pupils: Pupils are equal, round. Cardiovascular:     Rate and Rhythm: Normal rate.    Pulses: Normal pulses.  Pulmonary:     Effort: No respiratory distress or increased work of breathing.  Speaks in full sentences. Abdominal:     General: Abdomen is flat. No distension.   Musculoskeletal: Normal range of motion. No lower extremity swelling or edema. No varicosities. *** Skin:    General: Skin is warm and dry.     Findings: No erythema or rash.  Neurological:     Mental Status: Alert and oriented to person, place, and time.  Psychiatric:        Mood and Affect: Mood normal.        Behavior: Behavior normal.    Assessment/Plan: The patient is scheduled for bilateral breast reduction with possible free nipple graft with Dr. Domenica Reamer.  Risks, benefits, and alternatives of procedure discussed, questions answered and consent obtained.    Smoking Status: ***; Counseling Given? *** Last Mammogram: ***; Results: ***  Caprini Score: ***; Risk  Factors include: ***, BMI *** 25, and length of planned surgery. Recommendation for mechanical *** pharmacological prophylaxis. Encourage early ambulation.   Pictures obtained: ***  Post-op Rx sent to pharmacy: ***  Patient was provided with the *** General Surgical Risk consent document and Pain Medication Agreement prior to their appointment.  They had adequate time to read through the risk consent documents and Pain Medication Agreement. We also discussed them in person together during this preop appointment. All of their questions were answered to their satisfaction.  Recommended calling if they have any further questions.  Risk consent form and Pain Medication Agreement to be scanned into patient's chart.  ***   Electronically signed by: Evelena Leyden, PA-C 04/16/2022 4:51 PM

## 2022-04-17 ENCOUNTER — Encounter (HOSPITAL_BASED_OUTPATIENT_CLINIC_OR_DEPARTMENT_OTHER): Payer: Self-pay | Admitting: Plastic Surgery

## 2022-04-18 NOTE — Progress Notes (Signed)
   Patient ID: Marissa Simpson, female    DOB: 08/23/1976, 46 y.o.   MRN: [id number]  Chief Complaint  Patient presents with   Pre-op Exam      ICD-10-CM   1. Breast hypertrophy  N62     2. Chronic bilateral thoracic back pain  M54.6    G89.29       History of Present Illness: Marissa Simpson is a 46 y.o.  female  with a history of macromastia.  She presents for preoperative evaluation for upcoming procedure, Bilateral Breast Reduction, scheduled for 04/24/22 with Dr.  Luppens  The patient has not had problems with anesthesia. No history of DVT/PE.  No family history of DVT/PE.  No family or personal history of bleeding or clotting disorders.  Patient is not currently taking any blood thinners.  No history of MI.  She does have a history of a TIA.  Summary of Previous Visit: Mammogram January 2023 which was normal, no family history of breast cancer.  No tobacco use.  She has a history of being prediabetic, hemoglobin A1c was just above 5 most recently.  Preoperative bra size = H cup.  STN on the right is a 43 cm and the left is 39 cm.  Estimated excess breast tissue to be removed at time of surgery: 650 grams  Job: Medical assistant, she was planning 2 weeks out of work, however we discussed due to the nature of her job she may need to require more time out of work.  We will plan to write her out for FMLA for 4 weeks but she can return back sooner if she is doing well.  PMH Significant for: Hypertension, hyperlipidemia, prediabetes, history of TIA  She is on tramadol 50 mg 3 times a day for pain, prescribed by her PCP MacKenzie Hoffman, NP.  Patient reports she is feeling well, no recent changes to her health.  She is not having any cardiac or pulmonary symptoms.  She reports that she would like to be very small postoperatively, preferably a B cup and reports that she is okay being "flat".  She is aware that she will likely require free nipple graft  Past Medical  History: Allergies: Allergies  Allergen Reactions   Aspirin     Patient can't take due to stomach ulcers.   Darvocet [Propoxyphene N-Acetaminophen] Itching   Ibuprofen     Patient can't take due to stomach ulcers    Current Medications:  Current Outpatient Medications:    acetaminophen (TYLENOL) 650 MG CR tablet, Take by mouth as needed., Disp: , Rfl:    amLODipine (NORVASC) 2.5 MG tablet, Take 2.5 mg by mouth daily., Disp: , Rfl:    atorvastatin (LIPITOR) 20 MG tablet, Take 1 tablet (20 mg total) by mouth every evening., Disp: 30 tablet, Rfl: 1   baclofen (LIORESAL) 10 MG tablet, Take by mouth as needed., Disp: , Rfl:    calcium carbonate (TUMS - DOSED IN MG ELEMENTAL CALCIUM) 500 MG chewable tablet, Chew 1 tablet by mouth daily as needed for indigestion or heartburn., Disp: , Rfl:    Calcium Carbonate-Simethicone 750-80 MG CHEW, Chew 1 tablet by mouth daily as needed (heartburn)., Disp: , Rfl:    clonazePAM (KLONOPIN) 0.5 MG tablet, Take 0.25 mg by mouth as needed for anxiety., Disp: , Rfl:    cyclobenzaprine (FLEXERIL) 10 MG tablet, Take 1 tablet (10 mg total) by mouth at bedtime as needed for muscle spasms., Disp: 20 tablet, Rfl: 0     FLUoxetine (PROZAC) 10 MG capsule, Take 1 capsule by mouth daily., Disp: , Rfl:    hydrochlorothiazide (MICROZIDE) 12.5 MG capsule, Take 12.5 mg by mouth daily., Disp: , Rfl:    metFORMIN (GLUCOPHAGE) 500 MG tablet, Take by mouth., Disp: , Rfl:    nystatin (MYCOSTATIN/NYSTOP) powder, Apply to affected area 3 times daily, Disp: , Rfl:    omeprazole (PRILOSEC) 40 MG capsule, Take 1 capsule by mouth daily., Disp: , Rfl:    promethazine (PHENERGAN) 25 MG tablet, Take 25 mg by mouth every 8 (eight) hours as needed., Disp: , Rfl:    pseudoephedrine-acetaminophen (TYLENOL SINUS) 30-500 MG TABS tablet, Take 1 tablet by mouth every 4 (four) hours as needed., Disp: , Rfl:    Semaglutide, 2 MG/DOSE, (OZEMPIC, 2 MG/DOSE,) 8 MG/3ML SOPN, Inject into the skin., Disp:  , Rfl:    traMADol (ULTRAM) 50 MG tablet, Take 1 tablet (50 mg total) by mouth every 6 (six) hours as needed for moderate pain., Disp: 20 tablet, Rfl: 0   triamcinolone ointment (KENALOG) 0.1 %, Apply topically as needed., Disp: , Rfl:    Vitamin D, Ergocalciferol, (DRISDOL) 1.25 MG (50000 UNIT) CAPS capsule, Take 1 capsule (50,000 Units total) by mouth every 7 (seven) days., Disp: 5 capsule, Rfl: 0 No current facility-administered medications for this visit.  Facility-Administered Medications Ordered in Other Visits:    6 CHG cloth bath night before surgery, , , Once **AND** [START ON 04/20/2022] 6 CHG cloth bath AM of surgery, , , Once **AND** Chlorhexidine Gluconate Cloth 2 % PADS 6 each, 6 each, Topical, Once **AND** Chlorhexidine Gluconate Cloth 2 % PADS 6 each, 6 each, Topical, Once, Luppens, Daniel, MD  Past Medical Problems: Past Medical History:  Diagnosis Date   Anemia    Anxiety    Carpal tunnel syndrome    History of stomach ulcers    Hypertension    Kidney stones kidney stones   Pre-diabetes    TIA (transient ischemic attack) 11/06/2020    Past Surgical History: Past Surgical History:  Procedure Laterality Date   CARPAL TUNNEL RELEASE     INCISION AND DRAINAGE Left 11/22/2021   Finger   TUBAL LIGATION      Social History: Social History   Socioeconomic History   Marital status: Legally Separated    Spouse name: Not on file   Number of children: Not on file   Years of education: Not on file   Highest education level: Not on file  Occupational History   Not on file  Tobacco Use   Smoking status: Never   Smokeless tobacco: Never  Vaping Use   Vaping Use: Never used  Substance and Sexual Activity   Alcohol use: No   Drug use: No   Sexual activity: Yes    Birth control/protection: Surgical  Other Topics Concern   Not on file  Social History Narrative   Not on file   Social Determinants of Health   Financial Resource Strain: Not on file  Food  Insecurity: Not on file  Transportation Needs: Not on file  Physical Activity: Not on file  Stress: Not on file  Social Connections: Not on file  Intimate Partner Violence: Not on file    Family History: No family history on file.  Review of Systems: Review of Systems  Constitutional: Negative.   Respiratory: Negative.    Cardiovascular: Negative.   Neurological: Negative.     Physical Exam: Vital Signs BP 120/79 (BP Location: Left Arm, Patient Position: Sitting,  Cuff Size: Large)   Pulse 77   Ht 5\' 3"  (1.6 m)   Wt 187 lb (84.8 kg)   SpO2 99%   BMI 33.13 kg/m   Physical Exam  Constitutional:      General: Not in acute distress.    Appearance: Normal appearance. Not ill-appearing.  HENT:     Head: Normocephalic and atraumatic.  Eyes:     Pupils: Pupils are equal, round Neck:     Musculoskeletal: Normal range of motion.  Cardiovascular:     Rate and Rhythm: Normal rate    Pulses: Normal pulses.  Pulmonary:     Effort: Pulmonary effort is normal. No respiratory distress.  Musculoskeletal: Normal range of motion.  Skin:    General: Skin is warm and dry.     Findings: No erythema or rash.  Neurological:     General: No focal deficit present.     Mental Status: Alert and oriented to person, place, and time. Mental status is at baseline.     Motor: No weakness.  Psychiatric:        Mood and Affect: Mood normal.        Behavior: Behavior normal.    Assessment/Plan: The patient is scheduled for bilateral breast reduction with Dr. .  Risks, benefits, and alternatives of procedure discussed, questions answered and consent obtained.    Smoking Status: Non-smoker; Counseling Given?  N/A Last Mammogram: January 2023; Results: Negative  Caprini Score: 4, moderate; Risk Factors include: Age, BMI greater than 25, and length of planned surgery. Recommendation for mechanical prophylaxis. Encourage early ambulation.   Pictures obtained: @consult   Post-op Rx sent  to pharmacy:  [facility] Patient is on chronic tramadol 50 mg 3 times per day prescribed by February 2023, DNP.  I discussed with the patient today that I recommend she reach out to her pain management provider for information in regards to postoperative pain control given she is eventually on a pain contract and we do not want to violate her contract by prescribing postop narcotics.  I discussed with the patient that I would also send off a surgical clearance form to request clarification on patient's pain contract.  **Prior to patient's departure from the office, she discussed with the front desk that her PCP was out of town until Monday, 04/23/2022 and her surgery is 04/24/2022.  I notified the front desk staff to inform the patient that she would need to call their office on Monday, 04/23/2022 to discuss with the provider that day.  We would not be able to prescribe narcotics until we receive clearance from her PCP to avoid voiding her pain contract, if applicable**  Patient was provided with the breast reduction and General Surgical Risk consent document and Pain Medication Agreement prior to their appointment.  They had adequate time to read through the risk consent documents and Pain Medication Agreement. We also discussed them in person together during this preop appointment. All of their questions were answered to their satisfaction.  Recommended calling if they have any further questions.  Risk consent form and Pain Medication Agreement to be scanned into patient's chart.  The risk that can be encountered with breast reduction were discussed and include the following but not limited to these:  Breast asymmetry, fluid accumulation, firmness of the breast, inability to breast feed, loss of nipple or areola, skin loss, decrease or no nipple sensation, fat necrosis of the breast tissue, bleeding, infection, healing delay.  There are risks of anesthesia, changes to skin  sensation and injury to nerves or  blood vessels.  The muscle can be temporarily or permanently injured.  You may have an allergic reaction to tape, suture, glue, blood products which can result in skin discoloration, swelling, pain, skin lesions, poor healing.  Any of these can lead to the need for revisonal surgery or stage procedures.  A reduction has potential to interfere with diagnostic procedures.  Nipple or breast piercing can increase risks of infection.  This procedure is best done when the breast is fully developed.  Changes in the breast will continue to occur over time.  Pregnancy can alter the outcomes of previous breast reduction surgery, weight gain and weigh loss can also effect the long term appearance.   We discussed the high likelihood of amputation/free nipple graft technique due to the length of her STN.  She is understanding of the possibility that we would need to transition from a pedicle technique to a free nipple graft technique intraoperatively.  We discussed the risks associated with free nipple graft breast reductions, including but not limited to failure of the graft, partial loss of the graft, loss of sensation of bilateral nipple areola, complete loss of the nipple areola graft, inability to breast-feed, postoperative wounds, ongoing wound care.  We also discussed the risks associated with the pedicle technique.  We discussed that with the pedicle technique she could develop nipple areolar necrosis which would result in loss of the nipple, this would also result in ongoing wound care and possible changes in the shape of her breast.   Of note, patient is on Semaglutide (Ozempic) and doses weekly on Mondays.  She reports that her most recent dose was on Monday, 04/16/2022.  Typically recommend to hold this 1 week prior to surgery to decrease risk of aspiration intraoperatively due to delayed gastric emptying.  She will be 8 days post injection on the day of surgery.     Electronically signed by: Kermit Balo  Precilla Purnell, PA-C 04/19/2022 2:38 PM

## 2022-04-18 NOTE — H&P (View-Only) (Signed)
Patient ID: Marissa Simpson, female    DOB: 12/31/1975, 46 y.o.   MRN: [id number]  Chief Complaint  Patient presents with   Pre-op Exam      ICD-10-CM   1. Breast hypertrophy  N62     2. Chronic bilateral thoracic back pain  M54.6    G89.29       History of Present Illness: Marissa Simpson is a 46 y.o.  female  with a history of macromastia.  She presents for preoperative evaluation for upcoming procedure, Bilateral Breast Reduction, scheduled for 04/24/22 with Dr.  Domenica Reamer  The patient has not had problems with anesthesia. No history of DVT/PE.  No family history of DVT/PE.  No family or personal history of bleeding or clotting disorders.  Patient is not currently taking any blood thinners.  No history of MI.  She does have a history of a TIA.  Summary of Previous Visit: Mammogram January 2023 which was normal, no family history of breast cancer.  No tobacco use.  She has a history of being prediabetic, hemoglobin A1c was just above 5 most recently.  Preoperative bra size = H cup.  STN on the right is a 43 cm and the left is 39 cm.  Estimated excess breast tissue to be removed at time of surgery: 650 grams  Job: Engineer, site, she was planning 2 weeks out of work, however we discussed due to the nature of her job she may need to require more time out of work.  We will plan to write her out for [facility] for 4 weeks but she can return back sooner if she is doing well.  PMH Significant for: Hypertension, hyperlipidemia, prediabetes, history of TIA  She is on tramadol 50 mg 3 times a day for pain, prescribed by her PCP Sandrea Matte, NP.  Patient reports she is feeling well, no recent changes to her health.  She is not having any cardiac or pulmonary symptoms.  She reports that she would like to be very small postoperatively, preferably a B cup and reports that she is okay being "flat".  She is aware that she will likely require free nipple graft  Past Medical  History: Allergies: Allergies  Allergen Reactions   Aspirin     Patient can't take due to stomach ulcers.   Darvocet [Propoxyphene N-Acetaminophen] Itching   Ibuprofen     Patient can't take due to stomach ulcers    Current Medications:  Current Outpatient Medications:    acetaminophen (TYLENOL) 650 MG CR tablet, Take by mouth as needed., Disp: , Rfl:    amLODipine (NORVASC) 2.5 MG tablet, Take 2.5 mg by mouth daily., Disp: , Rfl:    atorvastatin (LIPITOR) 20 MG tablet, Take 1 tablet (20 mg total) by mouth every evening., Disp: 30 tablet, Rfl: 1   baclofen (LIORESAL) 10 MG tablet, Take by mouth as needed., Disp: , Rfl:    calcium carbonate (TUMS - DOSED IN MG ELEMENTAL CALCIUM) 500 MG chewable tablet, Chew 1 tablet by mouth daily as needed for indigestion or heartburn., Disp: , Rfl:    Calcium Carbonate-Simethicone 750-80 MG CHEW, Chew 1 tablet by mouth daily as needed (heartburn)., Disp: , Rfl:    clonazePAM (KLONOPIN) 0.5 MG tablet, Take 0.25 mg by mouth as needed for anxiety., Disp: , Rfl:    cyclobenzaprine (FLEXERIL) 10 MG tablet, Take 1 tablet (10 mg total) by mouth at bedtime as needed for muscle spasms., Disp: 20 tablet, Rfl: 0  FLUoxetine (PROZAC) 10 MG capsule, Take 1 capsule by mouth daily., Disp: , Rfl:    hydrochlorothiazide (MICROZIDE) 12.5 MG capsule, Take 12.5 mg by mouth daily., Disp: , Rfl:    metFORMIN (GLUCOPHAGE) 500 MG tablet, Take by mouth., Disp: , Rfl:    nystatin (MYCOSTATIN/NYSTOP) powder, Apply to affected area 3 times daily, Disp: , Rfl:    omeprazole (PRILOSEC) 40 MG capsule, Take 1 capsule by mouth daily., Disp: , Rfl:    promethazine (PHENERGAN) 25 MG tablet, Take 25 mg by mouth every 8 (eight) hours as needed., Disp: , Rfl:    pseudoephedrine-acetaminophen (TYLENOL SINUS) 30-500 MG TABS tablet, Take 1 tablet by mouth every 4 (four) hours as needed., Disp: , Rfl:    Semaglutide, 2 MG/DOSE, (OZEMPIC, 2 MG/DOSE,) 8 MG/3ML SOPN, Inject into the skin., Disp:  , Rfl:    traMADol (ULTRAM) 50 MG tablet, Take 1 tablet (50 mg total) by mouth every 6 (six) hours as needed for moderate pain., Disp: 20 tablet, Rfl: 0   triamcinolone ointment (KENALOG) 0.1 %, Apply topically as needed., Disp: , Rfl:    Vitamin D, Ergocalciferol, (DRISDOL) 1.25 MG (50000 UNIT) CAPS capsule, Take 1 capsule (50,000 Units total) by mouth every 7 (seven) days., Disp: 5 capsule, Rfl: 0 No current facility-administered medications for this visit.  Facility-Administered Medications Ordered in Other Visits:    6 CHG cloth bath night before surgery, , , Once **AND** [START ON 04/20/2022] 6 CHG cloth bath AM of surgery, , , Once **AND** Chlorhexidine Gluconate Cloth 2 % PADS 6 each, 6 each, Topical, Once **AND** Chlorhexidine Gluconate Cloth 2 % PADS 6 each, 6 each, Topical, Once, Luppens, Daniel, MD  Past Medical Problems: Past Medical History:  Diagnosis Date   Anemia    Anxiety    Carpal tunnel syndrome    History of stomach ulcers    Hypertension    Kidney stones kidney stones   Pre-diabetes    TIA (transient ischemic attack) 11/06/2020    Past Surgical History: Past Surgical History:  Procedure Laterality Date   CARPAL TUNNEL RELEASE     INCISION AND DRAINAGE Left 11/22/2021   Finger   TUBAL LIGATION      Social History: Social History   Socioeconomic History   Marital status: Legally Separated    Spouse name: Not on file   Number of children: Not on file   Years of education: Not on file   Highest education level: Not on file  Occupational History   Not on file  Tobacco Use   Smoking status: Never   Smokeless tobacco: Never  Vaping Use   Vaping Use: Never used  Substance and Sexual Activity   Alcohol use: No   Drug use: No   Sexual activity: Yes    Birth control/protection: Surgical  Other Topics Concern   Not on file  Social History Narrative   Not on file   Social Determinants of Health   Financial Resource Strain: Not on file  Food  Insecurity: Not on file  Transportation Needs: Not on file  Physical Activity: Not on file  Stress: Not on file  Social Connections: Not on file  Intimate Partner Violence: Not on file    Family History: No family history on file.  Review of Systems: Review of Systems  Constitutional: Negative.   Respiratory: Negative.    Cardiovascular: Negative.   Neurological: Negative.     Physical Exam: Vital Signs BP 120/79 (BP Location: Left Arm, Patient Position: Sitting,  Cuff Size: Large)   Pulse 77   Ht 5\' 3"  (1.6 m)   Wt 187 lb (84.8 kg)   SpO2 99%   BMI 33.13 kg/m   Physical Exam  Constitutional:      General: Not in acute distress.    Appearance: Normal appearance. Not ill-appearing.  HENT:     Head: Normocephalic and atraumatic.  Eyes:     Pupils: Pupils are equal, round Neck:     Musculoskeletal: Normal range of motion.  Cardiovascular:     Rate and Rhythm: Normal rate    Pulses: Normal pulses.  Pulmonary:     Effort: Pulmonary effort is normal. No respiratory distress.  Musculoskeletal: Normal range of motion.  Skin:    General: Skin is warm and dry.     Findings: No erythema or rash.  Neurological:     General: No focal deficit present.     Mental Status: Alert and oriented to person, place, and time. Mental status is at baseline.     Motor: No weakness.  Psychiatric:        Mood and Affect: Mood normal.        Behavior: Behavior normal.    Assessment/Plan: The patient is scheduled for bilateral breast reduction with Dr. .  Risks, benefits, and alternatives of procedure discussed, questions answered and consent obtained.    Smoking Status: Non-smoker; Counseling Given?  N/A Last Mammogram: January 2023; Results: Negative  Caprini Score: 4, moderate; Risk Factors include: Age, BMI greater than 25, and length of planned surgery. Recommendation for mechanical prophylaxis. Encourage early ambulation.   Pictures obtained: @consult   Post-op Rx sent  to pharmacy:  Phenergan Patient is on chronic tramadol 50 mg 3 times per day prescribed by February 2023, DNP.  I discussed with the patient today that I recommend she reach out to her pain management provider for information in regards to postoperative pain control given she is eventually on a pain contract and we do not want to violate her contract by prescribing postop narcotics.  I discussed with the patient that I would also send off a surgical clearance form to request clarification on patient's pain contract.  **Prior to patient's departure from the office, she discussed with the front desk that her PCP was out of town until Monday, 04/23/2022 and her surgery is 04/24/2022.  I notified the front desk staff to inform the patient that she would need to call their office on Monday, 04/23/2022 to discuss with the provider that day.  We would not be able to prescribe narcotics until we receive clearance from her PCP to avoid voiding her pain contract, if applicable**  Patient was provided with the breast reduction and General Surgical Risk consent document and Pain Medication Agreement prior to their appointment.  They had adequate time to read through the risk consent documents and Pain Medication Agreement. We also discussed them in person together during this preop appointment. All of their questions were answered to their satisfaction.  Recommended calling if they have any further questions.  Risk consent form and Pain Medication Agreement to be scanned into patient's chart.  The risk that can be encountered with breast reduction were discussed and include the following but not limited to these:  Breast asymmetry, fluid accumulation, firmness of the breast, inability to breast feed, loss of nipple or areola, skin loss, decrease or no nipple sensation, fat necrosis of the breast tissue, bleeding, infection, healing delay.  There are risks of anesthesia, changes to skin  sensation and injury to nerves or  blood vessels.  The muscle can be temporarily or permanently injured.  You may have an allergic reaction to tape, suture, glue, blood products which can result in skin discoloration, swelling, pain, skin lesions, poor healing.  Any of these can lead to the need for revisonal surgery or stage procedures.  A reduction has potential to interfere with diagnostic procedures.  Nipple or breast piercing can increase risks of infection.  This procedure is best done when the breast is fully developed.  Changes in the breast will continue to occur over time.  Pregnancy can alter the outcomes of previous breast reduction surgery, weight gain and weigh loss can also effect the long term appearance.   We discussed the high likelihood of amputation/free nipple graft technique due to the length of her STN.  She is understanding of the possibility that we would need to transition from a pedicle technique to a free nipple graft technique intraoperatively.  We discussed the risks associated with free nipple graft breast reductions, including but not limited to failure of the graft, partial loss of the graft, loss of sensation of bilateral nipple areola, complete loss of the nipple areola graft, inability to breast-feed, postoperative wounds, ongoing wound care.  We also discussed the risks associated with the pedicle technique.  We discussed that with the pedicle technique she could develop nipple areolar necrosis which would result in loss of the nipple, this would also result in ongoing wound care and possible changes in the shape of her breast.   Of note, patient is on Semaglutide (Ozempic) and doses weekly on Mondays.  She reports that her most recent dose was on Monday, 04/16/2022.  Typically recommend to hold this 1 week prior to surgery to decrease risk of aspiration intraoperatively due to delayed gastric emptying.  She will be 8 days post injection on the day of surgery.     Electronically signed by: Kermit Balo  Jenevie Casstevens, PA-C 04/19/2022 2:38 PM

## 2022-04-19 ENCOUNTER — Telehealth: Payer: Self-pay | Admitting: Plastic Surgery

## 2022-04-19 ENCOUNTER — Encounter: Payer: Self-pay | Admitting: Surgical

## 2022-04-19 ENCOUNTER — Encounter (HOSPITAL_BASED_OUTPATIENT_CLINIC_OR_DEPARTMENT_OTHER): Payer: Self-pay | Admitting: Plastic Surgery

## 2022-04-19 ENCOUNTER — Ambulatory Visit (INDEPENDENT_AMBULATORY_CARE_PROVIDER_SITE_OTHER): Payer: Commercial Managed Care - PPO | Admitting: Surgical

## 2022-04-19 ENCOUNTER — Other Ambulatory Visit: Payer: Self-pay

## 2022-04-19 ENCOUNTER — Encounter (HOSPITAL_BASED_OUTPATIENT_CLINIC_OR_DEPARTMENT_OTHER)
Admission: RE | Admit: 2022-04-19 | Discharge: 2022-04-19 | Disposition: A | Discharge status: Home / Self Care | Payer: Commercial Managed Care - PPO | Source: Ambulatory Visit | Attending: Plastic Surgery | Admitting: Plastic Surgery

## 2022-04-19 VITALS — BP 120/79 | HR 77 | Ht 63.0 in | Wt 187.0 lb

## 2022-04-19 DIAGNOSIS — M546 Pain in thoracic spine: Secondary | ICD-10-CM

## 2022-04-19 DIAGNOSIS — N62 Hypertrophy of breast: Secondary | ICD-10-CM

## 2022-04-19 DIAGNOSIS — Z01818 Encounter for other preprocedural examination: Secondary | ICD-10-CM | POA: Insufficient documentation

## 2022-04-19 DIAGNOSIS — G8929 Other chronic pain: Secondary | ICD-10-CM

## 2022-04-19 MED ORDER — CHLORHEXIDINE GLUCONATE CLOTH 2 % EX PADS: 6.0000 | MEDICATED_PAD | Freq: Once | CUTANEOUS | Status: DC

## 2022-04-19 NOTE — Progress Notes (Signed)
Surgical soap given to patient, instructions given, patient verbalized understanding.   

## 2022-04-19 NOTE — H&P (View-Only) (Signed)
Surgical soap given to patient, instructions given, patient verbalized understanding.   

## 2022-04-19 NOTE — Telephone Encounter (Signed)
Patient advised today by PA Scheeler to contact her PCP to inform her he'll be prescribing something stronger than tramadol for after her surgery. Patient stated her PCP is out of the office until Monday (on vacation).   Spoke with PA Scheeler to inform him. He advised he would notification to the office of the patient's PCP so it will be there waiting when her PCP returns on Monday. As requested by PA, patient agreed to contact her PCP office on Monday to follow up, explain the situation, and ask them to respond. Patient also aware he will hold off on sending in any scripts until after he receives a response from the PCP.

## 2022-04-20 ENCOUNTER — Telehealth: Payer: Self-pay

## 2022-04-20 ENCOUNTER — Encounter: Payer: Commercial Managed Care - PPO | Admitting: Physician Assistant

## 2022-04-20 LAB — BASIC METABOLIC PANEL
Anion gap: 7 (ref 5–15)
BUN: 9 mg/dL (ref 6–20)
CO2: 29 mmol/L (ref 22–32)
Calcium: 9.4 mg/dL (ref 8.9–10.3)
Chloride: 106 mmol/L (ref 98–111)
Creatinine, Ser: 0.89 mg/dL (ref 0.44–1.00)
GFR, Estimated: >60 mL/min (ref >60)
Glucose, Bld: 77 mg/dL (ref 70–99)
Potassium: 4 mmol/L (ref 3.5–5.1)
Sodium: 142 mmol/L (ref 135–145)

## 2022-04-20 NOTE — Telephone Encounter (Signed)
Faxed surgery clearance to Max Sane, DNP

## 2022-04-23 ENCOUNTER — Telehealth: Payer: Self-pay | Admitting: *Deleted

## 2022-04-23 ENCOUNTER — Telehealth: Payer: Self-pay | Admitting: Surgical

## 2022-04-23 NOTE — Telephone Encounter (Signed)
Pt is calling in stating that she was wanting to see if you rec'd the surgical clearance so that she can get her medication today since her surgery is tomorrow at 7:00 a.m.  Pt would like to have a call back.

## 2022-04-23 NOTE — Telephone Encounter (Signed)
Returned patients call. Advised she was cleared for surgery tomorrow.

## 2022-04-23 NOTE — Telephone Encounter (Signed)
Received on (04/19/22) via of fax DME Standard Written Order from [facility].  Requesting review,sign,and return.  Given to provider to review.  DME Standard Written Order reviewed,signed,and return to [facility].  Confirmation received and copy scanned into the chart.//AB/CMA

## 2022-04-24 ENCOUNTER — Ambulatory Visit (HOSPITAL_BASED_OUTPATIENT_CLINIC_OR_DEPARTMENT_OTHER): Payer: Commercial Managed Care - PPO | Admitting: Anesthesiology

## 2022-04-24 ENCOUNTER — Encounter (HOSPITAL_BASED_OUTPATIENT_CLINIC_OR_DEPARTMENT_OTHER): Payer: Self-pay | Admitting: Plastic Surgery

## 2022-04-24 ENCOUNTER — Other Ambulatory Visit: Payer: Self-pay

## 2022-04-24 ENCOUNTER — Telehealth: Payer: Self-pay

## 2022-04-24 ENCOUNTER — Encounter (HOSPITAL_BASED_OUTPATIENT_CLINIC_OR_DEPARTMENT_OTHER)
Admission: RE | Disposition: A | Discharge status: Home / Self Care | Payer: Self-pay | Source: Home / Self Care | Attending: Plastic Surgery

## 2022-04-24 ENCOUNTER — Ambulatory Visit (HOSPITAL_BASED_OUTPATIENT_CLINIC_OR_DEPARTMENT_OTHER)
Admission: RE | Admit: 2022-04-24 | Discharge: 2022-04-24 | Disposition: A | Discharge status: Home / Self Care | Payer: Commercial Managed Care - PPO | Attending: Plastic Surgery | Admitting: Plastic Surgery

## 2022-04-24 DIAGNOSIS — E119 Type 2 diabetes mellitus without complications: Secondary | ICD-10-CM | POA: Diagnosis not present

## 2022-04-24 DIAGNOSIS — R7303 Prediabetes: Secondary | ICD-10-CM

## 2022-04-24 DIAGNOSIS — I10 Essential (primary) hypertension: Secondary | ICD-10-CM | POA: Insufficient documentation

## 2022-04-24 DIAGNOSIS — Z01818 Encounter for other preprocedural examination: Secondary | ICD-10-CM

## 2022-04-24 DIAGNOSIS — Z79899 Other long term (current) drug therapy: Secondary | ICD-10-CM

## 2022-04-24 DIAGNOSIS — N62 Hypertrophy of breast: Secondary | ICD-10-CM | POA: Insufficient documentation

## 2022-04-24 DIAGNOSIS — Z7984 Long term (current) use of oral hypoglycemic drugs: Secondary | ICD-10-CM | POA: Insufficient documentation

## 2022-04-24 HISTORY — DX: Prediabetes: R73.03

## 2022-04-24 HISTORY — DX: Anemia, unspecified: D64.9

## 2022-04-24 HISTORY — PX: BREAST REDUCTION SURGERY: SHX8

## 2022-04-24 LAB — GLUCOSE, CAPILLARY
Glucose-Capillary: 105 mg/dL — ABNORMAL HIGH (ref 70–99)
Glucose-Capillary: 119 mg/dL — ABNORMAL HIGH (ref 70–99)

## 2022-04-24 LAB — POCT PREGNANCY, URINE: Preg Test, Ur: NEGATIVE

## 2022-04-24 SURGERY — MAMMOPLASTY, REDUCTION
Anesthesia: General | Site: Breast | Laterality: Bilateral

## 2022-04-24 MED ORDER — FENTANYL CITRATE (PF) 100 MCG/2ML IJ SOLN
INTRAMUSCULAR | Status: AC
  Filled 2022-04-24: qty 2

## 2022-04-24 MED ORDER — BUPIVACAINE HCL (PF) 0.25 % IJ SOLN
INTRAMUSCULAR | Status: AC
  Filled 2022-04-24: qty 60

## 2022-04-24 MED ORDER — MINERAL OIL LIGHT OIL
TOPICAL_OIL | Status: DC | PRN
  Administered 2022-04-24: 4 via TOPICAL

## 2022-04-24 MED ORDER — FENTANYL CITRATE (PF) 100 MCG/2ML IJ SOLN
INTRAMUSCULAR | Status: DC | PRN
  Administered 2022-04-24 (×6): 50 ug via INTRAVENOUS

## 2022-04-24 MED ORDER — ACETAMINOPHEN 10 MG/ML IV SOLN
INTRAVENOUS | Status: DC | PRN
  Administered 2022-04-24: 1000 mg via INTRAVENOUS

## 2022-04-24 MED ORDER — KETOROLAC TROMETHAMINE 30 MG/ML IJ SOLN
INTRAMUSCULAR | Status: AC
  Filled 2022-04-24: qty 1

## 2022-04-24 MED ORDER — EPINEPHRINE PF 1 MG/ML IJ SOLN
INTRAMUSCULAR | Status: AC
  Filled 2022-04-24: qty 1

## 2022-04-24 MED ORDER — OXYCODONE HCL 5 MG/5ML PO SOLN: 5.0000 mg | Freq: Once | ORAL | Status: DC | PRN

## 2022-04-24 MED ORDER — LIDOCAINE HCL (CARDIAC) PF 100 MG/5ML IV SOSY
PREFILLED_SYRINGE | INTRAVENOUS | Status: DC | PRN
  Administered 2022-04-24: 100 mg via INTRAVENOUS

## 2022-04-24 MED ORDER — OXYCODONE HCL 5 MG PO TABS: 5.0000 mg | ORAL_TABLET | Freq: Once | ORAL | Status: DC | PRN

## 2022-04-24 MED ORDER — MINERAL OIL LIGHT OIL
TOPICAL_OIL | Status: AC
  Filled 2022-04-24: qty 40

## 2022-04-24 MED ORDER — PROPOFOL 10 MG/ML IV BOLUS
INTRAVENOUS | Status: DC | PRN
  Administered 2022-04-24: 200 mg via INTRAVENOUS

## 2022-04-24 MED ORDER — LACTATED RINGERS IV SOLN
INTRAVENOUS | Status: DC
Start: 2022-04-24 — End: 2022-04-24

## 2022-04-24 MED ORDER — HYDROMORPHONE HCL 1 MG/ML IJ SOLN
0.2500 mg | INTRAMUSCULAR | Status: DC | PRN
  Administered 2022-04-24 (×3): 0.5 mg via INTRAVENOUS

## 2022-04-24 MED ORDER — 0.9 % SODIUM CHLORIDE (POUR BTL) OPTIME
TOPICAL | Status: DC | PRN
  Administered 2022-04-24: 200 mL

## 2022-04-24 MED ORDER — ACETAMINOPHEN 10 MG/ML IV SOLN
INTRAVENOUS | Status: AC
  Filled 2022-04-24: qty 100

## 2022-04-24 MED ORDER — AMISULPRIDE (ANTIEMETIC) 5 MG/2ML IV SOLN: 10.0000 mg | Freq: Once | INTRAVENOUS | Status: DC | PRN

## 2022-04-24 MED ORDER — DEXAMETHASONE SODIUM PHOSPHATE 10 MG/ML IJ SOLN
INTRAMUSCULAR | Status: AC
  Filled 2022-04-24: qty 1

## 2022-04-24 MED ORDER — MIDAZOLAM HCL 2 MG/2ML IJ SOLN
INTRAMUSCULAR | Status: AC
  Filled 2022-04-24: qty 2

## 2022-04-24 MED ORDER — LACTATED RINGERS IV SOLN
INTRAVENOUS | Status: DC | PRN
  Administered 2022-04-24: 1000 mL

## 2022-04-24 MED ORDER — ROCURONIUM BROMIDE 100 MG/10ML IV SOLN
INTRAVENOUS | Status: DC | PRN
  Administered 2022-04-24: 100 mg via INTRAVENOUS

## 2022-04-24 MED ORDER — ONDANSETRON HCL 4 MG/2ML IJ SOLN
INTRAMUSCULAR | Status: DC | PRN
  Administered 2022-04-24: 4 mg via INTRAVENOUS

## 2022-04-24 MED ORDER — MIDAZOLAM HCL 5 MG/5ML IJ SOLN
INTRAMUSCULAR | Status: DC | PRN
  Administered 2022-04-24: 2 mg via INTRAVENOUS

## 2022-04-24 MED ORDER — SUGAMMADEX SODIUM 200 MG/2ML IV SOLN
INTRAVENOUS | Status: DC | PRN
  Administered 2022-04-24: 200 mg via INTRAVENOUS

## 2022-04-24 MED ORDER — OXYCODONE HCL 5 MG PO CAPS: 5.0000 mg | ORAL_CAPSULE | Freq: Four times a day (QID) | ORAL | 0 refills | Status: DC | PRN

## 2022-04-24 MED ORDER — DEXAMETHASONE SODIUM PHOSPHATE 4 MG/ML IJ SOLN
INTRAMUSCULAR | Status: DC | PRN
  Administered 2022-04-24: 10 mg via INTRAVENOUS

## 2022-04-24 MED ORDER — CEFAZOLIN SODIUM-DEXTROSE 2-4 GM/100ML-% IV SOLN
2.0000 g | INTRAVENOUS | Status: AC
Start: 2022-04-24 — End: 2022-04-24
  Administered 2022-04-24: 2 g via INTRAVENOUS

## 2022-04-24 MED ORDER — ONDANSETRON HCL 4 MG/2ML IJ SOLN: 4.0000 mg | Freq: Once | INTRAMUSCULAR | Status: DC | PRN

## 2022-04-24 MED ORDER — LIDOCAINE 2% (20 MG/ML) 5 ML SYRINGE
INTRAMUSCULAR | Status: AC
  Filled 2022-04-24: qty 5

## 2022-04-24 MED ORDER — HYDROMORPHONE HCL 1 MG/ML IJ SOLN
INTRAMUSCULAR | Status: AC
  Filled 2022-04-24: qty 0.5

## 2022-04-24 MED ORDER — CEFAZOLIN SODIUM-DEXTROSE 2-4 GM/100ML-% IV SOLN
INTRAVENOUS | Status: AC
  Filled 2022-04-24: qty 100

## 2022-04-24 MED ORDER — ROCURONIUM BROMIDE 10 MG/ML (PF) SYRINGE
PREFILLED_SYRINGE | INTRAVENOUS | Status: AC
  Filled 2022-04-24: qty 10

## 2022-04-24 MED ORDER — ONDANSETRON HCL 4 MG/2ML IJ SOLN
INTRAMUSCULAR | Status: AC
  Filled 2022-04-24: qty 2

## 2022-04-24 MED ORDER — HYDROMORPHONE HCL 1 MG/ML IJ SOLN
INTRAMUSCULAR | Status: AC
  Filled 2022-04-24: qty 1

## 2022-04-24 SURGICAL SUPPLY — 82 items
APL PRP STRL LF DISP 70% ISPRP (MISCELLANEOUS) ×2
APL SKNCLS STERI-STRIP NONHPOA (GAUZE/BANDAGES/DRESSINGS) ×2
BALL CTTN LRG ABS STRL LF (GAUZE/BANDAGES/DRESSINGS) ×3
BENZOIN TINCTURE PRP APPL 2/3 (GAUZE/BANDAGES/DRESSINGS) ×6 IMPLANT
BINDER BREAST LRG (GAUZE/BANDAGES/DRESSINGS) IMPLANT
BINDER BREAST MEDIUM (GAUZE/BANDAGES/DRESSINGS) IMPLANT
BINDER BREAST XLRG (GAUZE/BANDAGES/DRESSINGS) ×1 IMPLANT
BINDER BREAST XXLRG (GAUZE/BANDAGES/DRESSINGS) IMPLANT
BIOPATCH RED 1 DISK 7.0 (GAUZE/BANDAGES/DRESSINGS) IMPLANT
BLADE SURG 10 STRL SS (BLADE) ×14 IMPLANT
BLADE SURG 11 STRL SS (BLADE) ×1 IMPLANT
BLADE SURG 15 STRL LF DISP TIS (BLADE) ×4 IMPLANT
BLADE SURG 15 STRL SS (BLADE) ×4
BNDG GAUZE DERMACEA FLUFF (GAUZE/BANDAGES/DRESSINGS) ×2
BNDG GAUZE DERMACEA FLUFF 4 (GAUZE/BANDAGES/DRESSINGS) ×4 IMPLANT
BNDG GZE DERMACEA 4 6PLY (GAUZE/BANDAGES/DRESSINGS) ×2
CANISTER SUCT 1200ML W/VALVE (MISCELLANEOUS) ×3 IMPLANT
CHLORAPREP W/TINT 26 (MISCELLANEOUS) ×6 IMPLANT
COTTONBALL LRG STERILE PKG (GAUZE/BANDAGES/DRESSINGS) ×3 IMPLANT
COVER BACK TABLE 60X90IN (DRAPES) ×3 IMPLANT
COVER MAYO STAND STRL (DRAPES) ×3 IMPLANT
DEVICE DSSCT PLSMBLD 3.0S LGHT (MISCELLANEOUS) IMPLANT
DRAIN CHANNEL 15F RND FF W/TCR (WOUND CARE) ×3 IMPLANT
DRAPE LAPAROSCOPIC ABDOMINAL (DRAPES) ×3 IMPLANT
DRAPE UTILITY XL STRL (DRAPES) ×3 IMPLANT
DRESSING MEPILEX FLEX 4X4 (GAUZE/BANDAGES/DRESSINGS) IMPLANT
DRSG MEPILEX BORDER 4X8 (GAUZE/BANDAGES/DRESSINGS) ×8 IMPLANT
DRSG MEPILEX FLEX 4X4 (GAUZE/BANDAGES/DRESSINGS) ×4
DRSG PAD ABDOMINAL 8X10 ST (GAUZE/BANDAGES/DRESSINGS) ×6 IMPLANT
ELECT COATED BLADE 2.86 ST (ELECTRODE) ×3 IMPLANT
ELECT REM PT RETURN 9FT ADLT (ELECTROSURGICAL) ×2
ELECTRODE REM PT RTRN 9FT ADLT (ELECTROSURGICAL) ×2 IMPLANT
EVACUATOR SILICONE 100CC (DRAIN) ×2 IMPLANT
GAUZE SPONGE 4X4 12PLY STRL (GAUZE/BANDAGES/DRESSINGS) ×6 IMPLANT
GAUZE XEROFORM 5X9 LF (GAUZE/BANDAGES/DRESSINGS) ×1 IMPLANT
GLOVE BIOGEL M STRL SZ7.5 (GLOVE) ×4 IMPLANT
GLOVE BIOGEL PI IND STRL 7.5 (GLOVE) ×2 IMPLANT
GLOVE BIOGEL PI INDICATOR 7.5 (GLOVE) ×3
GLOVE SURG SS PI 7.5 STRL IVOR (GLOVE) ×6 IMPLANT
GOWN STRL REUS W/ TWL LRG LVL3 (GOWN DISPOSABLE) ×2 IMPLANT
GOWN STRL REUS W/TWL LRG LVL3 (GOWN DISPOSABLE) ×2
GOWN STRL REUS W/TWL XL LVL3 (GOWN DISPOSABLE) ×6 IMPLANT
MARKER SKIN DUAL TIP RULER LAB (MISCELLANEOUS) ×1 IMPLANT
NDL FILTER BLUNT 18X1 1/2 (NEEDLE) ×2 IMPLANT
NDL HYPO 25X1 1.5 SAFETY (NEEDLE) IMPLANT
NDL SAFETY ECLIPSE 18X1.5 (NEEDLE) ×2 IMPLANT
NDL SPNL 18GX3.5 QUINCKE PK (NEEDLE) IMPLANT
NEEDLE FILTER BLUNT 18X 1/2SAF (NEEDLE) ×1
NEEDLE FILTER BLUNT 18X1 1/2 (NEEDLE) ×1 IMPLANT
NEEDLE HYPO 18GX1.5 SHARP (NEEDLE) ×2
NEEDLE HYPO 25X1 1.5 SAFETY (NEEDLE) IMPLANT
NEEDLE SPNL 18GX3.5 QUINCKE PK (NEEDLE) ×2 IMPLANT
NS IRRIG 1000ML POUR BTL (IV SOLUTION) ×3 IMPLANT
PACK BASIN DAY SURGERY FS (CUSTOM PROCEDURE TRAY) ×3 IMPLANT
PENCIL SMOKE EVACUATOR (MISCELLANEOUS) ×3 IMPLANT
PLASMABLADE 3.0S W/LIGHT (MISCELLANEOUS)
SLEEVE SCD COMPRESS KNEE MED (STOCKING) ×3 IMPLANT
SPONGE T-LAP 18X18 ~~LOC~~+RFID (SPONGE) ×10 IMPLANT
STAPLER INSORB 30 2030 C-SECTI (MISCELLANEOUS) ×3 IMPLANT
STAPLER VISISTAT 35W (STAPLE) ×4 IMPLANT
STRIP SUTURE WOUND CLOSURE 1/2 (MISCELLANEOUS) ×9 IMPLANT
SUT CHROMIC 4 0 PS 2 18 (SUTURE) ×3 IMPLANT
SUT MNCRL AB 4-0 PS2 18 (SUTURE) ×1 IMPLANT
SUT MON AB 5-0 PS2 18 (SUTURE) ×3 IMPLANT
SUT PDS 3-0 CT2 (SUTURE) ×12
SUT PDS AB 4-0 SH 27 (SUTURE) ×6 IMPLANT
SUT PDS II 3-0 CT2 27 ABS (SUTURE) ×6 IMPLANT
SUT SILK 2 0 SH (SUTURE) ×2 IMPLANT
SUT SILK 3 0 SH 30 (SUTURE) ×1 IMPLANT
SUT SILK 3 0 SH CR/8 (SUTURE) ×3 IMPLANT
SUT VIC AB 2-0 SH 27 (SUTURE) ×4
SUT VIC AB 2-0 SH 27XBRD (SUTURE) ×4 IMPLANT
SUT VLOC 90 3-0 CLR P12 (SUTURE) ×7 IMPLANT
SYR 50ML LL SCALE MARK (SYRINGE) IMPLANT
SYR BULB IRRIG 60ML STRL (SYRINGE) ×3 IMPLANT
SYR CONTROL 10ML LL (SYRINGE) IMPLANT
TAPE MEASURE VINYL STERILE (MISCELLANEOUS) IMPLANT
TOWEL GREEN STERILE FF (TOWEL DISPOSABLE) ×6 IMPLANT
TUBE CONNECTING 20X1/4 (TUBING) ×3 IMPLANT
TUBING INFILTRATION IT-10001 (TUBING) ×3 IMPLANT
UNDERPAD 30X36 HEAVY ABSORB (UNDERPADS AND DIAPERS) ×6 IMPLANT
YANKAUER SUCT BULB TIP NO VENT (SUCTIONS) ×3 IMPLANT

## 2022-04-24 NOTE — Anesthesia Preprocedure Evaluation (Addendum)
Anesthesia Evaluation  Patient identified by MRN, date of birth, ID band Patient awake    Reviewed: Allergy & Precautions, NPO status , Patient's Chart, lab work & pertinent test results  History of Anesthesia Complications Negative for: history of anesthetic complications  Airway Mallampati: III  TM Distance: >3 FB Neck ROM: Full    Dental  (+) Dental Advisory Given, Teeth Intact   Pulmonary sleep apnea ,    Pulmonary exam normal        Cardiovascular hypertension, Pt. on medications Normal cardiovascular exam  Echo 11/07/20: EF 50-55%, no RWMA, mild LVH, normal DF, normal RVSF, normal valves   Neuro/Psych Anxiety TIA   GI/Hepatic Neg liver ROS, PUD, Ozempic held this week   Endo/Other  diabetes (Pre-DM), Oral Hypoglycemic Agents  Renal/GU negative Renal ROS  negative genitourinary   Musculoskeletal negative musculoskeletal ROS (+)   Abdominal   Peds  Hematology negative hematology ROS (+)   Anesthesia Other Findings   Reproductive/Obstetrics                           Anesthesia Physical Anesthesia Plan  ASA: 2  Anesthesia Plan: General   Post-op Pain Management: Ofirmev IV (intra-op)*   Induction: Intravenous  PONV Risk Score and Plan: 3 and Ondansetron, Dexamethasone, Treatment may vary due to age or medical condition and Midazolam  Airway Management Planned: Oral ETT  Additional Equipment: None  Intra-op Plan:   Post-operative Plan: Extubation in OR  Informed Consent: I have reviewed the patients History and Physical, chart, labs and discussed the procedure including the risks, benefits and alternatives for the proposed anesthesia with the patient or authorized representative who has indicated his/her understanding and acceptance.     Dental advisory given  Plan Discussed with:   Anesthesia Plan Comments:        Anesthesia Quick Evaluation

## 2022-04-24 NOTE — Discharge Instructions (Addendum)
Activity: Avoid strenuous activity.  No lifting, pushing, or pulling greater than 15 pounds.  Diet: No restrictions.  Try to optimize nutrition with plenty of proteins, fruits, and vegetables to improve healing.   Wound Care: Leave breast binder on for three days. After that, you can shower with your back to the shower head.  Sponge bathing in the interim. Try not to get the nipple bolsters too wet.  At that time, you may transition to a front-clipping/front-zipping compressive sports bra. You may continue to use the breast binder if that is preferred. Leave the brown dressings in place.  If you have any drainage from behind the dressings, apply gauze as needed.    Be sure to record the daily output from each side. They will be removed at your 1 or 2 week post-op visit, depending on your exam.   Do NOT combine the prescribed oxycodone with your previously prescribed tramadol.   Follow-Up: Scheduled for next week.  Things to watch for:  Call the office if you experience fever, chills, intractable vomiting, or significant bleeding.  Mild wound drainage is common after breast reduction surgery and should not be cause for alarm.    Post Anesthesia Home Care Instructions  Activity: Get plenty of rest for the remainder of the day. A responsible individual must stay with you for 24 hours following the procedure.  For the next 24 hours, DO NOT: -Drive a car -Advertising copywriter -Drink alcoholic beverages -Take any medication unless instructed by your physician -Make any legal decisions or sign important papers.  Meals: Start with liquid foods such as gelatin or soup. Progress to regular foods as tolerated. Avoid greasy, spicy, heavy foods. If nausea and/or vomiting occur, drink only clear liquids until the nausea and/or vomiting subsides. Call your physician if vomiting continues.  Special Instructions/Symptoms: Your throat may feel dry or sore from the anesthesia or the breathing tube placed in  your throat during surgery. If this causes discomfort, gargle with warm salt water. The discomfort should disappear within 24 hours.  If you had a scopolamine patch placed behind your ear for the management of post- operative nausea and/or vomiting:  1. The medication in the patch is effective for 72 hours, after which it should be removed.  Wrap patch in a tissue and discard in the trash. Wash hands thoroughly with soap and water. 2. You may remove the patch earlier than 72 hours if you experience unpleasant side effects which may include dry mouth, dizziness or visual disturbances. 3. Avoid touching the patch. Wash your hands with soap and water after contact with the patch.     Post Anesthesia Home Care Instructions  Activity: Get plenty of rest for the remainder of the day. A responsible individual must stay with you for 24 hours following the procedure.  For the next 24 hours, DO NOT: -Drive a car -Advertising copywriter -Drink alcoholic beverages -Take any medication unless instructed by your physician -Make any legal decisions or sign important papers.  Meals: Start with liquid foods such as gelatin or soup. Progress to regular foods as tolerated. Avoid greasy, spicy, heavy foods. If nausea and/or vomiting occur, drink only clear liquids until the nausea and/or vomiting subsides. Call your physician if vomiting continues.  Special Instructions/Symptoms: Your throat may feel dry or sore from the anesthesia or the breathing tube placed in your throat during surgery. If this causes discomfort, gargle with warm salt water. The discomfort should disappear within 24 hours.  If you had a  scopolamine patch placed behind your ear for the management of post- operative nausea and/or vomiting:  1. The medication in the patch is effective for 72 hours, after which it should be removed.  Wrap patch in a tissue and discard in the trash. Wash hands thoroughly with soap and water. 2. You may remove the  patch earlier than 72 hours if you experience unpleasant side effects which may include dry mouth, dizziness or visual disturbances. 3. Avoid touching the patch. Wash your hands with soap and water after contact with the patch.   About my Jackson-Pratt Bulb Drain  What is a Jackson-Pratt bulb? A Jackson-Pratt is a soft, round device used to collect drainage. It is connected to a long, thin drainage catheter, which is held in place by one or two small stiches near your surgical incision site. When the bulb is squeezed, it forms a vacuum, forcing the drainage to empty into the bulb.  Emptying the Jackson-Pratt bulb- To empty the bulb: 1. Release the plug on the top of the bulb. 2. Pour the bulb's contents into a measuring container which your nurse will provide. 3. Record the time emptied and amount of drainage. Empty the drain(s) as often as your     doctor or nurse recommends.  Date                  Time                    Amount (Drain 1)                 Amount (Drain 2)  _____________________________________________________________________  _____________________________________________________________________  _____________________________________________________________________  _____________________________________________________________________  _____________________________________________________________________  _____________________________________________________________________  _____________________________________________________________________  _____________________________________________________________________  Squeezing the Jackson-Pratt Bulb- To squeeze the bulb: 1. Make sure the plug at the top of the bulb is open. 2. Squeeze the bulb tightly in your fist. You will hear air squeezing from the bulb. 3. Replace the plug while the bulb is squeezed. 4. Use a safety pin to attach the bulb to your clothing. This will keep the catheter from     pulling at the bulb  insertion site.  When to call your doctor- Call your doctor if: Drain site becomes red, swollen or hot. You have a fever greater than 101 degrees F. There is oozing at the drain site. Drain falls out (apply a guaze bandage over the drain hole and secure it with tape). Drainage increases daily not related to activity patterns. (You will usually have more drainage when you are active than when you are resting.) Drainage has a bad odor.   Next dose of Tylenol can be given at 2:00pm if needed.

## 2022-04-24 NOTE — Transfer of Care (Signed)
Immediate Anesthesia Transfer of Care Note  Patient: Marissa Simpson  Procedure(s) Performed: MAMMARY REDUCTION  (BREAST) (Bilateral: Breast)  Patient Location: PACU  Anesthesia Type:General  Level of Consciousness: awake  Airway & Oxygen Therapy: Patient Spontanous Breathing and Patient connected to face mask oxygen  Post-op Assessment: Report given to RN and Post -op Vital signs reviewed and stable  Post vital signs: Reviewed and stable  Last Vitals:  Vitals Value Taken Time  BP    Temp    Pulse 89 04/24/22 1049  Resp 19 04/24/22 1049  SpO2 100 % 04/24/22 1049  Vitals shown include unvalidated device data.  Last Pain:  Vitals:   04/24/22 0624  TempSrc: Oral  PainSc: 0-No pain      Patients Stated Pain Goal: 3 (04/24/22 4562)  Complications: No notable events documented.

## 2022-04-24 NOTE — Interval H&P Note (Signed)
History and Physical Interval Note:  04/24/2022 7:22 AM  Marissa Simpson  has presented today for surgery, with the diagnosis of Bilateral Hypertrophic Breast.  The various methods of treatment have been discussed with the patient and family. After consideration of risks, benefits and other options for treatment, the patient has consented to  Procedure(s): MAMMARY REDUCTION  (BREAST) (Bilateral) as a surgical intervention.  The patient's history has been reviewed, patient examined, no change in status, stable for surgery.  I have reviewed the patient's chart and labs.  Questions were answered to the patient's satisfaction.     [name]

## 2022-04-24 NOTE — Anesthesia Postprocedure Evaluation (Signed)
Anesthesia Post Note  Patient: Marissa Simpson  Procedure(s) Performed: MAMMARY REDUCTION  (BREAST) (Bilateral: Breast)     Patient location during evaluation: PACU Anesthesia Type: General Level of consciousness: awake and alert Pain management: pain level controlled Vital Signs Assessment: post-procedure vital signs reviewed and stable Respiratory status: spontaneous breathing, nonlabored ventilation and respiratory function stable Cardiovascular status: blood pressure returned to baseline and stable Postop Assessment: no apparent nausea or vomiting Anesthetic complications: no   No notable events documented.  Last Vitals:  Vitals:   04/24/22 1205 04/24/22 1234  BP:  135/85  Pulse: 90 (!) 57  Resp: 13 18  Temp:  (!) 36.3 C  SpO2: 97% 95%    Last Pain:  Vitals:   04/24/22 1234  TempSrc: Oral  PainSc: 2                  Lucretia Kern

## 2022-04-24 NOTE — Anesthesia Procedure Notes (Signed)
Procedure Name: Intubation Date/Time: 04/24/2022 7:54 AM  Performed by: Tawni Millers, CRNAPre-anesthesia Checklist: Patient identified, Emergency Drugs available, Suction available and Patient being monitored Patient Re-evaluated:Patient Re-evaluated prior to induction Oxygen Delivery Method: Circle system utilized Preoxygenation: Pre-oxygenation with 100% oxygen Induction Type: IV induction Ventilation: Mask ventilation without difficulty Laryngoscope Size: Mac and 3 Grade View: Grade I Tube type: Oral Tube size: 7.0 mm Number of attempts: 1 Airway Equipment and Method: Stylet and Oral airway Placement Confirmation: ETT inserted through vocal cords under direct vision, positive ETCO2 and breath sounds checked- equal and bilateral Tube secured with: Tape Dental Injury: Teeth and Oropharynx as per pre-operative assessment

## 2022-04-24 NOTE — Op Note (Signed)
Operative Note   DATE OF OPERATION: 04/24/2022  LOCATION: [facility]   SURGICAL DEPARTMENT: Plastic Surgery  PREOPERATIVE DIAGNOSES: Bilateral symptomatic macromastia.  POSTOPERATIVE DIAGNOSES:  same  PROCEDURE: Bilateral breast reduction with free nipple graft.  SURGEON: Lajean Manes, MD  ASSISTANT:  Evelena Leyden, PAC,  Matt Scheeler, [facility]  ANESTHESIA: General.  COMPLICATIONS: None.   INDICATIONS FOR PROCEDURE:  The patient, Marissa Simpson is a 46 y.o. female born on 08/02/1976, is here for treatment of bilateral symptomatic macromastia. MRN: [id number]  CONSENT:  Informed consent was obtained directly from the patient. Risks, benefits and alternatives were fully discussed. Specific risks including but not limited to bleeding, infection, hematoma, seroma, scarring, pain, infection, contracture, asymmetry, wound healing problems, nipple necrosis, nipple numbness and need for further surgery were all discussed. The patient did have an ample opportunity to have questions answered to satisfaction.   DESCRIPTION OF PROCEDURE:  The patient was marked preoperatively for a Wise pattern skin excision.  The patient was taken to the operating room. SCDs were placed and antibiotics were given. General anesthesia was administered.The patient's operative site was prepped and draped in a sterile fashion. A time out was performed and all information was confirmed to be correct.  Right Breast: The breast was infiltrated with tumescent solution to help with hemostasis.  The nipple was marked with a cookie cutter and excised in preparation for grafting.  The excision was then performed with cautery.  Hemostasis was obtained and the wound was stapled closed.  Left breast:  The breast was infiltrated with tumescent solution to help with hemostasis.  The nipple was marked with a cookie cutter and excised in preparation for grafting.  The excision was then performed with cautery.   Hemostasis was obtained and the wound was stapled closed.  Patient was then checked for size and symmetry.  Minor modifications were made.  This resulted in a total of 1015 g removed from the significantly larger right side and 688 g removed from the left side.  The inframammary incision was closed interrupted 3-0 PDS suture.  The vertical and limbs were closed with interrupted buried 4-0 PDS and a running 3-0 V-lock suture.  Nipple location was marked with a 76mm cookie cutter and deepithelialized.  Nipple grafts were then thinned out and inset with 4-0 Chromic.  Xeroform and a cotton balls were used to fashion and bolster that was secured with 3-0 Nylon.  Steri-Strips were then applied along with a soft dressing and Ace wrap.  The advanced practice practitioner (APP) Evelena Leyden, was the primary assistant and assisted throughout the case.  The APP was essential in retraction and counter traction when needed to make the case progress smoothly.  This retraction and assistance made it possible to see the tissue planes for the procedure.  The assistance was needed for hemostasis, tissue re-approximation and closure of the incision site.    The patient tolerated the procedure well.  There were no complications. The patient was allowed to wake from anesthesia, extubated and taken to the recovery room in satisfactory condition.  I was present for the entire procedure.

## 2022-04-24 NOTE — Telephone Encounter (Signed)
[facility]. Spoke with French Ana. Verified NP did not read the notation if it was ok for [place] to give pain medication during PO. However, she okay for [place] to prescribe oxycodone. Will send fax back over for her to verify and sign off again.

## 2022-04-25 ENCOUNTER — Encounter (HOSPITAL_BASED_OUTPATIENT_CLINIC_OR_DEPARTMENT_OTHER): Payer: Self-pay | Admitting: Plastic Surgery

## 2022-04-25 LAB — SURGICAL PATHOLOGY

## 2022-04-27 ENCOUNTER — Telehealth: Payer: Self-pay | Admitting: Physician Assistant

## 2022-04-30 ENCOUNTER — Ambulatory Visit (INDEPENDENT_AMBULATORY_CARE_PROVIDER_SITE_OTHER): Payer: Commercial Managed Care - PPO | Admitting: Physician Assistant

## 2022-04-30 ENCOUNTER — Encounter: Payer: Self-pay | Admitting: Physician Assistant

## 2022-04-30 DIAGNOSIS — Z9889 Other specified postprocedural states: Secondary | ICD-10-CM

## 2022-04-30 NOTE — Telephone Encounter (Signed)
She takes narcotic pain medication chronically through her pain management clinic so I would presume she is already aware that she cannot drive while taking them as they can be sedating.  Otherwise, she is cleared to drive.

## 2022-04-30 NOTE — Telephone Encounter (Signed)
Informed pt that from a surgical standpoint she is ok to drive, but if she is taking any narcotic/pain medication, then she is not okay to drive. Pt conveyed understanding.

## 2022-04-30 NOTE — Progress Notes (Signed)
Patient is a 46 year old female with PMH of macromastia and chronic bilateral thoracic back pain s/p bilateral breast reduction with free nipple graft performed 04/24/2022 by Dr. Domenica Reamer who presents to clinic for postoperative follow-up.  Preoperative bra size equals H cup.  STN 43 cm on the right and 39 cm on the left, decision was made for free nipple graft.  Reviewed operative report and 1015 g was removed from the significantly larger right side and 688 g was removed from the left side.  Nipple graft bolsters were fashioned from cotton balls wrapped in Xeroform.  Drains were utilized.    Today, patient is doing well.  States that her pain is well controlled.  She had to place Tegaderm over her dressings because they were starting to fall off.  She is hoping that the dressings can be removed.  She also reports that she has had minimal drainage from each bulb the entirety of her postoperative recovery.  Reviewed her log and she has had less than 30 cc/day from each drain for the past 5 days.  She is also endorsing some discomfort at the drain tube insertion sites.  Physical exam is entirely reassuring.  Breasts with excellent shape and symmetry.  Bolsters are removed.  NAC's are viable, intact.  No epidermal lysis.  Mild ecchymoses left breast.  No areas of firmness.  No obvious subcutaneous fluid collections.  Steri-Strips remain intact.  Majority of 2-0 silk sutures removed from NAC's.  Recommending daily Vaseline gauze to the NAC's.  She was provided Xeroform here in clinic.  Informed her that she will likely experience drainage from drain tube insertion sites.  Dry gauze x3 days before applying Vaseline and gauze daily.  Return in 10 to 14 days.  Patient educated on expected course of free nipple grafts.  We will obtain photos at subsequent encounter.

## 2022-05-01 NOTE — Interval H&P Note (Signed)
History and Physical Interval Note:  05/01/2022 12:18 PM  Marissa Simpson  has presented today for surgery, with the diagnosis of Bilateral Hypertrophic Breast.  The various methods of treatment have been discussed with the patient and family. After consideration of risks, benefits and other options for treatment, the patient has consented to  Procedure(s): MAMMARY REDUCTION  (BREAST) (Bilateral) as a surgical intervention.  The patient's history has been reviewed, patient examined, no change in status, stable for surgery.  I have reviewed the patient's chart and labs.  Questions were answered to the patient's satisfaction.     [name]

## 2022-05-03 ENCOUNTER — Ambulatory Visit (INDEPENDENT_AMBULATORY_CARE_PROVIDER_SITE_OTHER): Payer: Commercial Managed Care - PPO | Admitting: Surgical

## 2022-05-03 ENCOUNTER — Telehealth: Payer: Self-pay | Admitting: Plastic Surgery

## 2022-05-03 ENCOUNTER — Encounter: Payer: Self-pay | Admitting: Surgical

## 2022-05-03 DIAGNOSIS — Z9889 Other specified postprocedural states: Secondary | ICD-10-CM

## 2022-05-03 NOTE — Telephone Encounter (Signed)
Swelling seems to be more now and very tender to touch.  She is not sure if it is warm to touch. But would like someone to return her call

## 2022-05-03 NOTE — Progress Notes (Signed)
46 year old female here for follow-up after bilateral breast reduction via free nipple graft with Dr. Domenica Reamer on 04/24/2022.  She was last seen in the office 3 days ago.  Bilateral JP drains were removed due to low output.    She presents today for concerns about firmness of her right breast. 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 She reports she has noticed an odor from her bilateral breast and is concerned about this.  Chaperone present on exam On exam bilateral breast incisions CDI.  Dressings over bilateral NAC graft.  No subcutaneous fluid collection noted palpation.  She does have some firmness of the right breast that feels consistent with fat necrosis.  There is no erythema or cellulitic changes.   Patient is doing well postop from bilateral breast reduction via free nipple graft with Dr. Domenica Reamer on 04/24/2022.  She has been doing dressing changes to bilateral NAC's.  Recommend continuing with this.  We discussed ongoing restrictions of no lifting greater than 15 pounds, no strenuous activities, no for reaching.  Continue with compressive garments 24/7.  I think if she would like to return to work on Tuesday, 05/08/2022 that she can with restrictions which will be provided with her via letter.  Discussed with patient the odor is likely coming from the Steri-Strips.  These were removed.  No signs of infection on exam.  Recommend following up at her next scheduled appointment in approximately 1 week.

## 2022-05-07 NOTE — Telephone Encounter (Signed)
Attempted to call patient.  Call was sent to voicemail.

## 2022-05-08 ENCOUNTER — Telehealth: Payer: Self-pay | Admitting: Plastic Surgery

## 2022-05-08 NOTE — Telephone Encounter (Signed)
Outbound call placed to inform patient her FMLA paperwork for [employer] has been completed and signed. Fee of $25 has to be paid before paperwork can be faxed to patient's job. Requested call back.

## 2022-05-11 ENCOUNTER — Ambulatory Visit (INDEPENDENT_AMBULATORY_CARE_PROVIDER_SITE_OTHER): Payer: Commercial Managed Care - PPO | Admitting: Plastic Surgery

## 2022-05-11 DIAGNOSIS — Z719 Counseling, unspecified: Secondary | ICD-10-CM

## 2022-05-11 DIAGNOSIS — Z9889 Other specified postprocedural states: Secondary | ICD-10-CM

## 2022-05-11 NOTE — Progress Notes (Signed)
Status post bilateral breast reduction on 04/24/2022 with free nipple grafts.  The patient is doing well and very happy with initial result but she does notice an odor it sometimes.  Physical exam Incisions clean dry and intact, free nipple grafts with typical epidermolysis.  Satisfactory breast shape.  Approximately 4x4 cm nipple graft healing.  Assessment and plan Patient is doing well I just emphasized to her that the odor is normal and probably just some fat necrosis that is leaking from the incisions and her nipple grafts are healing as expected I did instruct her to avoid putting tape on her breasts every day.  We can use silicone dressings or just hold her dressing in place with a bra.

## 2022-05-14 ENCOUNTER — Telehealth: Payer: Self-pay | Admitting: Plastic Surgery

## 2022-05-14 ENCOUNTER — Ambulatory Visit (INDEPENDENT_AMBULATORY_CARE_PROVIDER_SITE_OTHER): Payer: Commercial Managed Care - PPO | Admitting: Surgical

## 2022-05-14 DIAGNOSIS — G8929 Other chronic pain: Secondary | ICD-10-CM

## 2022-05-14 DIAGNOSIS — Z9889 Other specified postprocedural states: Secondary | ICD-10-CM

## 2022-05-14 DIAGNOSIS — N62 Hypertrophy of breast: Secondary | ICD-10-CM

## 2022-05-14 MED ORDER — DOXYCYCLINE HYCLATE 100 MG PO TABS: 100.0000 mg | ORAL_TABLET | Freq: Two times a day (BID) | ORAL | 0 refills | Status: AC

## 2022-05-14 NOTE — Progress Notes (Signed)
Patient is a 46 year old female here for follow-up after bilateral breast reduction via free nipple graft on 04/24/2022 with Dr. Domenica Reamer.  She presents today for concerns of drainage from the right breast.  She denies any infectious symptoms.  She has been doing Xeroform dressing changes to bilateral NAC grafts.  Chaperone present on exam On exam bilateral breast incisions are overall intact, healing well.  Bilateral NAC's with expected epidermolysis, appear to be healing well with almost complete epithelialization of the left breast.  The right nipple has some mildly thickened eschar.  She does have some swelling of right breast, specifically laterally.  The incisions are not taut, there is no overlying skin changes laterally.  She does have some mild erythema over the medial right breast.  No cellulitic changes.  No foul odors.  Recommend continuing with Xeroform to bilateral NAC's daily, recommend increasing amount of Vaseline applied to the right nipple to help with thickened eschar.  We did aspirate the right lateral breast using a sterile technique, no fluid was aspirated.  Patient tolerated this well.  Continue with compressive garments.  Due to mild redness of the medial right breast, will prescribe doxycycline x5 days.  Pictures were obtained of the patient and placed in the chart with the patient's or guardian's permission.  Recommend calling with questions or concerns, we will plan to see her in about 10 days for follow-up.  We discussed return precautions.

## 2022-05-14 NOTE — Telephone Encounter (Signed)
Pt is having excessive drainage in rt breast and swelling and is wondering if she needs to be seen. Would like a call back asap at her work number [phone].

## 2022-05-17 ENCOUNTER — Telehealth: Payer: Self-pay

## 2022-05-17 NOTE — Telephone Encounter (Signed)
Per [facility]: Service has been provided for the patient. No further action required.

## 2022-05-24 ENCOUNTER — Ambulatory Visit (INDEPENDENT_AMBULATORY_CARE_PROVIDER_SITE_OTHER): Payer: Commercial Managed Care - PPO | Admitting: Surgical

## 2022-05-24 DIAGNOSIS — N62 Hypertrophy of breast: Secondary | ICD-10-CM

## 2022-05-24 DIAGNOSIS — Z9889 Other specified postprocedural states: Secondary | ICD-10-CM

## 2022-05-24 DIAGNOSIS — G8929 Other chronic pain: Secondary | ICD-10-CM

## 2022-05-24 MED ORDER — SULFAMETHOXAZOLE-TRIMETHOPRIM 800-160 MG PO TABS: 1.0000 | ORAL_TABLET | Freq: Two times a day (BID) | ORAL | 0 refills | Status: AC

## 2022-05-24 NOTE — Progress Notes (Signed)
Patient is a 46 year old female here for follow-up after bilateral breast reduction via free nipple graft on 04/24/2022 with Dr. Domenica Reamer.  She was last seen in the office on 05/14/2022.  She had some mild erythema over the medial right breast, was prescribed doxycycline for preventative reasons given the erythema.  Today patient reports she is overall doing well, reports that she has been doing Xeroform and Vaseline to bilateral NAC grafts.  She reports that she has noticed an odor from the right NAC graft.  She is not having any increased pain of the right breast, no fevers, chills.  She reports she did complete the doxycycline course, reports that she noticed some increased redness to the right medial breast approximately 3 days into the doxycycline course.  Chaperone present on exam On exam bilateral NAC grafts are healing well, the thickened eschar of the right nipple has improved and is beginning to soften.  There is no subcutaneous fluid collections noted with palpation.  There is some mild erythema over the medial right breast which is unchanged, I do not appreciate any crepitus or areas of fluctuance with palpation.  She does have an area of firmness that is approximately 1 x 1 cm, likely fat necrosis.  Recommend continuing with Vaseline and Xeroform to right NAC daily until the thickened distal nipple necrosis has softened up.  We did discuss that the medial right breast does not overall appear infected, however given the increased redness will prophylactically prescribe antibiotics.  May be reactive to fat necrosis or a small fluid collection.  I do not appreciate any significant signs of infection, she is asymptomatic as well.  Pictures were obtained of the patient and placed in the chart with the patient's or guardian's permission.  Recommend following up in 2 weeks for reevaluation.  Call with questions or concerns.

## 2022-05-25 ENCOUNTER — Ambulatory Visit: Payer: Commercial Managed Care - PPO | Admitting: Surgical

## 2022-06-08 ENCOUNTER — Ambulatory Visit (INDEPENDENT_AMBULATORY_CARE_PROVIDER_SITE_OTHER): Payer: Commercial Managed Care - PPO | Admitting: Surgical

## 2022-06-08 DIAGNOSIS — Z9889 Other specified postprocedural states: Secondary | ICD-10-CM

## 2022-06-08 NOTE — Progress Notes (Signed)
Patient is a 46 year old female here for follow-up after bilateral breast reduction via free nipple graft with Dr. Erin Hearing on 04/24/2022.  She is 6 and half weeks postop.  She reports overall she is doing well, she is not having any infectious symptoms.  She feels as if the nipple areola is healing well.  She reports she did recently hurt her left ankle falling down the stairs at home on the way to work and it is fractured.  She is wearing a boot today.  She does not know if she will need surgery or not.  Chaperone present on exam On exam bilateral NAC grafts are well-healed, pigmentation is beginning to return.  She does have a small area of scabbing on the right NAC centrally that is approximately 3 x 3 mm.  No erythema or cellulitic changes noted.  No subcutaneous fluid collection noted palpation.  Recommend continue with compressive garments for few more weeks, can begin wearing a normal bra in a few weeks.  Recommend Vaseline and gauze to the right NAC scabbing.  Recommend beginning using scar cream if she would like.  No signs of infection on exam.  We discussed scheduling a follow-up as needed, calling with questions or concerns.  Pictures were taken and placed in the patient chart with patient's permission.

## 2022-06-13 ENCOUNTER — Telehealth: Payer: Self-pay | Admitting: *Deleted

## 2022-06-13 NOTE — Telephone Encounter (Signed)
Received on (06/08/2022) via of fax DME Standard Written Order from [facility] [place].  Requesting signature,date and return.  Given to provider to sign and return.    DME Standard Written Order signed,dated,and faxed back to [facility] [place].  Confirmation received and copy.//AB/CMA

## 2022-07-06 ENCOUNTER — Encounter: Payer: Commercial Managed Care - PPO | Admitting: Physician Assistant

## 2022-07-20 ENCOUNTER — Institutional Professional Consult (permissible substitution): Payer: Commercial Managed Care - PPO | Admitting: Plastic Surgery

## 2022-10-01 ENCOUNTER — Telehealth: Payer: Self-pay | Admitting: *Deleted

## 2022-10-01 NOTE — Telephone Encounter (Signed)
Received on (09/12/2022) via of fax DME Standard Written Order from [facility].  Requesting signature and return.  Given to provider to sign.     DME Standard Written Order signed and faxed back to [facility].  Confirmation received and copy scanned into the chart.//AB/CMA

## 2022-12-02 ENCOUNTER — Emergency Department (HOSPITAL_BASED_OUTPATIENT_CLINIC_OR_DEPARTMENT_OTHER)
Admission: EM | Admit: 2022-12-02 | Discharge: 2022-12-02 | Disposition: A | Discharge status: Home / Self Care | Payer: BC Managed Care – PPO | Attending: Emergency Medicine | Admitting: Emergency Medicine

## 2022-12-02 ENCOUNTER — Other Ambulatory Visit: Payer: Self-pay

## 2022-12-02 ENCOUNTER — Emergency Department (HOSPITAL_BASED_OUTPATIENT_CLINIC_OR_DEPARTMENT_OTHER): Payer: BC Managed Care – PPO

## 2022-12-02 ENCOUNTER — Encounter (HOSPITAL_BASED_OUTPATIENT_CLINIC_OR_DEPARTMENT_OTHER): Payer: Self-pay

## 2022-12-02 DIAGNOSIS — R1084 Generalized abdominal pain: Secondary | ICD-10-CM | POA: Diagnosis present

## 2022-12-02 DIAGNOSIS — Z79899 Other long term (current) drug therapy: Secondary | ICD-10-CM | POA: Insufficient documentation

## 2022-12-02 DIAGNOSIS — D72829 Elevated white blood cell count, unspecified: Secondary | ICD-10-CM | POA: Diagnosis not present

## 2022-12-02 DIAGNOSIS — N202 Calculus of kidney with calculus of ureter: Secondary | ICD-10-CM | POA: Diagnosis not present

## 2022-12-02 DIAGNOSIS — R944 Abnormal results of kidney function studies: Secondary | ICD-10-CM | POA: Diagnosis not present

## 2022-12-02 DIAGNOSIS — N200 Calculus of kidney: Secondary | ICD-10-CM

## 2022-12-02 LAB — URINALYSIS, ROUTINE W REFLEX MICROSCOPIC
Bilirubin Urine: NEGATIVE
Glucose, UA: NEGATIVE mg/dL
Hgb urine dipstick: NEGATIVE
Leukocytes,Ua: NEGATIVE
Nitrite: NEGATIVE
Specific Gravity, Urine: 1.031 — ABNORMAL HIGH (ref 1.005–1.030)
pH: 6 (ref 5.0–8.0)

## 2022-12-02 LAB — CBC
HCT: 39.7 % (ref 36.0–46.0)
Hemoglobin: 12.6 g/dL (ref 12.0–15.0)
MCH: 27 pg (ref 26.0–34.0)
MCHC: 31.7 g/dL (ref 30.0–36.0)
MCV: 85 fL (ref 80.0–100.0)
Platelets: 270 10*3/uL (ref 150–400)
RBC: 4.67 MIL/uL (ref 3.87–5.11)
RDW: 13.5 % (ref 11.5–15.5)
WBC: 10.7 10*3/uL — ABNORMAL HIGH (ref 4.0–10.5)
nRBC: 0 % (ref 0.0–0.2)

## 2022-12-02 LAB — COMPREHENSIVE METABOLIC PANEL
ALT: 10 U/L (ref 0–44)
AST: 17 U/L (ref 15–41)
Albumin: 4.3 g/dL (ref 3.5–5.0)
Alkaline Phosphatase: 70 U/L (ref 38–126)
Anion gap: 8 (ref 5–15)
BUN: 20 mg/dL (ref 6–20)
CO2: 28 mmol/L (ref 22–32)
Calcium: 10.1 mg/dL (ref 8.9–10.3)
Chloride: 102 mmol/L (ref 98–111)
Creatinine, Ser: 1.55 mg/dL — ABNORMAL HIGH (ref 0.44–1.00)
GFR, Estimated: 41 mL/min — ABNORMAL LOW (ref >60)
Glucose, Bld: 92 mg/dL (ref 70–99)
Potassium: 3.6 mmol/L (ref 3.5–5.1)
Sodium: 138 mmol/L (ref 135–145)
Total Bilirubin: 0.4 mg/dL (ref 0.3–1.2)
Total Protein: 7.7 g/dL (ref 6.5–8.1)

## 2022-12-02 LAB — PREGNANCY, URINE: Preg Test, Ur: NEGATIVE

## 2022-12-02 LAB — LIPASE, BLOOD: Lipase: 27 U/L (ref 11–51)

## 2022-12-02 MED ORDER — ONDANSETRON HCL 4 MG PO TABS: 4.0000 mg | ORAL_TABLET | Freq: Three times a day (TID) | ORAL | 0 refills | Status: AC | PRN

## 2022-12-02 MED ORDER — IOHEXOL 300 MG/ML  SOLN
100.0000 mL | Freq: Once | INTRAMUSCULAR | Status: AC | PRN
  Administered 2022-12-02: 80 mL via INTRAVENOUS

## 2022-12-02 MED ORDER — MORPHINE SULFATE (PF) 4 MG/ML IV SOLN
4.0000 mg | Freq: Once | INTRAVENOUS | Status: AC
  Administered 2022-12-02: 4 mg via INTRAVENOUS
  Filled 2022-12-02: qty 1

## 2022-12-02 MED ORDER — ONDANSETRON HCL 4 MG/2ML IJ SOLN
4.0000 mg | Freq: Once | INTRAMUSCULAR | Status: AC
  Administered 2022-12-02: 4 mg via INTRAVENOUS
  Filled 2022-12-02: qty 2

## 2022-12-02 MED ORDER — SODIUM CHLORIDE 0.9 % IV BOLUS
1000.0000 mL | Freq: Once | INTRAVENOUS | Status: AC
  Administered 2022-12-02: 1000 mL via INTRAVENOUS

## 2022-12-02 NOTE — ED Provider Notes (Signed)
[facility] Provider Note   CSN: [id number] Arrival date & time: 12/02/22  1155     History  Chief Complaint  Patient presents with   Abdominal Pain    Marissa Simpson is a 47 y.o. female who presents the emergency department with concerns for generalized abdominal pain onset yesterday that has been constant.  Patient still notes that she has her gallbladder and appendix.  She notes she had pizza sticks prior to the onset of her symptoms started.  Has associated nausea, vomiting.  Tried tramadol and Tylenol at home without relief of her symptoms.  Denies sick contacts.  Denies urinary symptoms, chest pain, shortness of breath.    The history is provided by the patient. No language interpreter was used.       Home Medications Prior to Admission medications   Medication Sig Start Date End Date Taking? Authorizing Provider  ondansetron (ZOFRAN) 4 MG tablet Take 1 tablet (4 mg total) by mouth every 8 (eight) hours as needed for nausea or vomiting. 12/02/22  Yes Sayra Frisby A, PA-C  acetaminophen (TYLENOL) 650 MG CR tablet Take by mouth as needed.    [provider]  amLODipine (NORVASC) 2.5 MG tablet Take 2.5 mg by mouth daily. 10/12/20   [provider]  atorvastatin (LIPITOR) 20 MG tablet Take 1 tablet (20 mg total) by mouth every evening. 11/07/20   Johnson, Clanford L, MD  baclofen (LIORESAL) 10 MG tablet Take by mouth as needed. 08/17/21   [provider]  calcium carbonate (TUMS - DOSED IN MG ELEMENTAL CALCIUM) 500 MG chewable tablet Chew 1 tablet by mouth daily as needed for indigestion or heartburn.    [provider]  clonazePAM (KLONOPIN) 0.5 MG tablet Take 0.25 mg by mouth as needed for anxiety.    [provider]  cyclobenzaprine (FLEXERIL) 10 MG tablet Take 1 tablet (10 mg total) by mouth at bedtime as needed for muscle spasms. 03/16/16   Mesner, Corene Cornea, MD  FLUoxetine (PROZAC) 10 MG  capsule Take 1 capsule by mouth daily. 06/13/21   [provider]  hydrochlorothiazide (MICROZIDE) 12.5 MG capsule Take 12.5 mg by mouth daily. 10/12/20   [provider]  metFORMIN (GLUCOPHAGE) 500 MG tablet Take by mouth. 12/09/20   [provider]  omeprazole (PRILOSEC) 40 MG capsule Take 1 capsule by mouth daily. 01/11/22 01/11/23  [provider]  promethazine (PHENERGAN) 25 MG tablet Take 25 mg by mouth every 8 (eight) hours as needed. 11/13/21   [provider]  Semaglutide, 2 MG/DOSE, (OZEMPIC, 2 MG/DOSE,) 8 MG/3ML SOPN Inject into the skin. 02/28/22   [provider]  traMADol (ULTRAM) 50 MG tablet Take 1 tablet (50 mg total) by mouth every 6 (six) hours as needed for moderate pain. 03/16/16   Mesner, Corene Cornea, MD  triamcinolone ointment (KENALOG) 0.1 % Apply topically as needed. 11/15/21   [provider]  Vitamin D, Ergocalciferol, (DRISDOL) 1.25 MG (50000 UNIT) CAPS capsule Take 1 capsule (50,000 Units total) by mouth every 7 (seven) days. 11/14/20   Murlean Iba, MD      Allergies    Aspirin, Darvocet [propoxyphene n-acetaminophen], and Ibuprofen    Review of Systems   Review of Systems  Gastrointestinal:  Positive for abdominal pain.  All other systems reviewed and are negative.   Physical Exam Updated Vital Signs BP (!) 142/74 (BP Location: Right Arm)   Pulse (!) 102   Temp 98.3 F (36.8 C)  Resp 18   Ht 5\' 3"  (1.6 m)   Wt 84.8 kg   SpO2 100%   BMI 33.13 kg/m  Physical Exam Vitals and nursing note reviewed.  Constitutional:      General: She is not in acute distress.    Appearance: She is not diaphoretic.  HENT:     Head: Normocephalic and atraumatic.     Mouth/Throat:     Pharynx: No oropharyngeal exudate.  Eyes:     General: No scleral icterus.    Conjunctiva/sclera: Conjunctivae normal.  Cardiovascular:     Rate and Rhythm: Normal rate and regular rhythm.     Pulses: Normal pulses.     Heart  sounds: Normal heart sounds.  Pulmonary:     Effort: Pulmonary effort is normal. No respiratory distress.     Breath sounds: Normal breath sounds. No wheezing.  Abdominal:     General: Bowel sounds are normal.     Palpations: Abdomen is soft. There is no mass.     Tenderness: There is abdominal tenderness in the right upper quadrant, right lower quadrant and left lower quadrant. There is no guarding or rebound.     Comments: TTP noted to diffuse abdomen, more so to RUQ, RLQ, LLQ.  Musculoskeletal:        General: Normal range of motion.     Cervical back: Normal range of motion and neck supple.  Skin:    General: Skin is warm and dry.  Neurological:     Mental Status: She is alert.  Psychiatric:        Behavior: Behavior normal.     ED Results / Procedures / Treatments   Labs (all labs ordered are listed, but only abnormal results are displayed) Labs Reviewed  COMPREHENSIVE METABOLIC PANEL - Abnormal; Notable for the following components:      Result Value   Creatinine, Ser 1.55 (*)    GFR, Estimated 41 (*)    All other components within normal limits  CBC - Abnormal; Notable for the following components:   WBC 10.7 (*)    All other components within normal limits  URINALYSIS, ROUTINE W REFLEX MICROSCOPIC - Abnormal; Notable for the following components:   Specific Gravity, Urine 1.031 (*)    Ketones, ur TRACE (*)    Protein, ur TRACE (*)    All other components within normal limits  LIPASE, BLOOD  PREGNANCY, URINE    EKG None  Radiology CT ABDOMEN PELVIS W CONTRAST  Result Date: 12/02/2022 CLINICAL DATA:  Abdominal pain, EXAM: CT ABDOMEN AND PELVIS WITH CONTRAST TECHNIQUE: Multidetector CT imaging of the abdomen and pelvis was performed using the standard protocol following bolus administration of intravenous contrast. RADIATION DOSE REDUCTION: This exam was performed according to the departmental dose-optimization program which includes automated exposure control,  adjustment of the mA and/or kV according to patient size and/or use of iterative reconstruction technique. CONTRAST:  13mL OMNIPAQUE IOHEXOL 300 MG/ML  SOLN COMPARISON:  None Available. FINDINGS: Lower chest: Lung bases are clear. Hepatobiliary: No focal hepatic lesion. Normal gallbladder. No biliary duct dilatation. Common bile duct is normal. Pancreas: Pancreas is normal. No ductal dilatation. No pancreatic inflammation. Spleen: Normal spleen Adrenals/urinary tract: Adrenal glands normal. Hydronephrosis and proximal hydroureter of the LEFT kidney. Obstructing calculus within the proximal LEFT ureter measuring 4 mm on image 38/2. This proximal LEFT ureteral calculus is at the L2-L3a vertebral body level. There is delayed enhancement of the LEFT kidney and mild LEFT renal edema related to the  ureteral obstruction. No bladder calculi. Stomach/Bowel: Stomach, small bowel, appendix, and cecum are normal. The colon and rectosigmoid colon are normal. Vascular/Lymphatic: Abdominal aorta is normal caliber. No periportal or retroperitoneal adenopathy. No pelvic adenopathy. Reproductive: Uterus and adnexa unremarkable. Other: No free fluid. Musculoskeletal: No aggressive osseous lesion. IMPRESSION: Obstructing calculus in the proximal LEFT ureter. High-grade obstruction. Electronically Signed   By: Suzy Bouchard M.D.   On: 12/02/2022 15:39    Procedures Procedures    Medications Ordered in ED Medications  sodium chloride 0.9 % bolus 1,000 mL (0 mLs Intravenous Stopped 12/02/22 1600)  ondansetron (ZOFRAN) injection 4 mg (4 mg Intravenous Given 12/02/22 1444)  morphine (PF) 4 MG/ML injection 4 mg (4 mg Intravenous Given 12/02/22 1446)  iohexol (OMNIPAQUE) 300 MG/ML solution 100 mL (80 mLs Intravenous Contrast Given 12/02/22 1501)    ED Course/ Medical Decision Making/ A&P Clinical Course as of 12/02/22 1650  Sun Dec 02, 2022  1600 Re-evaluated and noted improvement of symptoms with treatment regimen.  [SB]   1640 Re-evaluated and noted improvement of symptoms with treatment regimen.  Discussed with patient lab and imaging findings.  Discussed discharge treatment plan. Pt agreeable at this time. Pt appears safe for discharge. [SB]    Clinical Course User Index [SB] Mae Denunzio A, PA-C                             Medical Decision Making Amount and/or Complexity of Data Reviewed Labs: ordered. Radiology: ordered.  Risk Prescription drug management.   Patient presents to the emergency department with generalized abdominal pain onset yesterday. On exam patient with TTP noted to diffuse abdomen, more so to RUQ, RLQ, LLQ. Remainder of exam without acute findings.  Pt afebrile. Differential diagnosis includes pancreatitis, cholecystitis, appendicitis, diverticulitis, acute cystitis.   Labs:  I ordered, and personally interpreted labs.  The pertinent results include:  Lipase unremarkable CBC with slightly elevated WBC at 10.7 otherwise unremarkable CMP with elevated creatinine at 1.55, Pregnancy urine negative Urinalysis without concerns for acute cystitis  Imaging: I ordered imaging studies including CT abdomen pelvis with I independently visualized and interpreted imaging which showed Obstructing calculus in the proximal LEFT ureter. High-grade obstruction. I agree with the radiologist interpretation  Medications:  I ordered medication including IVF, Zofran, and Morphine for symptom management. Reevaluation of the patient after these medicines and interventions, I reevaluated the patient and found that they have improved I have reviewed the patients home medicines and have made adjustments as needed Tolerated PO challenge in ED with above treatment regimen   Disposition: Presentation suspicious for kidney stone. Doubt pancreatitis, cholecystitis, appendicitis, acute cystitis.  Patient with a history of kidney stones most notably in June and December.  Patient has not been evaluated by  urology however has had her kidney stones managed by her primary care provider.  After consideration of the diagnostic results and the patients response to treatment, I feel that the patient would benefit from Discharge home.  Patient will be given a prescription for Zofran.  Patient has Flomax at home and is not in need of a refill at this time.  PDMP reviewed, patient had a prescription of tramadol sent on 11/30/2022 for 30 days.  Patient provided with information for [facility] regarding follow-up regarding recurrent kidney stones. Supportive care measures and strict return precautions discussed with patient at bedside. Pt acknowledges and verbalizes understanding. Pt appears safe for discharge. Follow up as indicated in  discharge paperwork.   This chart was dictated using voice recognition software, Dragon. Despite the best efforts of this provider to proofread and correct errors, errors may still occur which can change documentation meaning.   Final Clinical Impression(s) / ED Diagnoses Final diagnoses:  Kidney stone    Rx / DC Orders ED Discharge Orders          Ordered    ondansetron (ZOFRAN) 4 MG tablet  Every 8 hours PRN        12/02/22 1644              Vincentina Sollers A, PA-C 12/02/22 1829    Lacretia Leigh, MD 12/03/22 1418

## 2022-12-02 NOTE — Discharge Instructions (Addendum)
It was a pleasure to take care of you tonight!  Your urinalysis was negative for infection. Your CT scan showed concerns for a kidney stone today. You will be sent a prescription for zofran, take these medications as prescribed.  You may take your prescription tramadol as prescribed for your pain as needed for breakthrough pain. Ensure to maintain fluid intake. You may take over the counter 500 mg tylenol every 6 hours as needed for your symptoms. Attached is information for the on-call urologist, you may call and set up a follow-up appointment regarding today's ED visit.  You may follow-up with your primary care provider regarding today's ED visit.  Return to the ED if you are experiencing increasing/worsening back pain, vomiting, fever, abdominal pain, worsening symptoms.

## 2022-12-02 NOTE — ED Triage Notes (Signed)
Patient here POV from Home.  Endorses ABD Pain and Nausea that began at 1400 yesterday. Mostly generalized and radiates to Mid/Lower ABD at times. Constant mostly.   1 Episode of Emesis yesterday. No Diarrhea or Constipation. No Known Fever.   NAD Noted during Triage. A&Ox4. GCS 15. Ambulatory.

## 2022-12-06 ENCOUNTER — Other Ambulatory Visit: Payer: Self-pay | Admitting: Urology

## 2022-12-06 MED ORDER — BISACODYL 5 MG PO TBEC: 5.0000 mg | DELAYED_RELEASE_TABLET | Freq: Every day | ORAL | Status: AC | PRN

## 2022-12-07 ENCOUNTER — Encounter (HOSPITAL_BASED_OUTPATIENT_CLINIC_OR_DEPARTMENT_OTHER): Payer: Self-pay | Admitting: Urology

## 2022-12-07 ENCOUNTER — Other Ambulatory Visit: Payer: Self-pay

## 2022-12-07 ENCOUNTER — Emergency Department (HOSPITAL_COMMUNITY)
Admission: EM | Admit: 2022-12-07 | Discharge: 2022-12-07 | Disposition: A | Discharge status: Home / Self Care | Payer: BC Managed Care – PPO | Attending: Emergency Medicine | Admitting: Emergency Medicine

## 2022-12-07 ENCOUNTER — Emergency Department (HOSPITAL_COMMUNITY): Payer: BC Managed Care – PPO

## 2022-12-07 ENCOUNTER — Telehealth: Payer: Self-pay | Admitting: *Deleted

## 2022-12-07 DIAGNOSIS — I10 Essential (primary) hypertension: Secondary | ICD-10-CM | POA: Insufficient documentation

## 2022-12-07 DIAGNOSIS — R000 Tachycardia, unspecified: Secondary | ICD-10-CM | POA: Insufficient documentation

## 2022-12-07 DIAGNOSIS — Z8673 Personal history of transient ischemic attack (TIA), and cerebral infarction without residual deficits: Secondary | ICD-10-CM | POA: Insufficient documentation

## 2022-12-07 DIAGNOSIS — Z79899 Other long term (current) drug therapy: Secondary | ICD-10-CM | POA: Diagnosis not present

## 2022-12-07 DIAGNOSIS — N23 Unspecified renal colic: Secondary | ICD-10-CM | POA: Insufficient documentation

## 2022-12-07 DIAGNOSIS — R109 Unspecified abdominal pain: Secondary | ICD-10-CM | POA: Diagnosis present

## 2022-12-07 LAB — CBC WITH DIFFERENTIAL/PLATELET
Abs Immature Granulocytes: 0.01 10*3/uL (ref 0.00–0.07)
Basophils Absolute: 0 10*3/uL (ref 0.0–0.1)
Basophils Relative: 1 %
Eosinophils Absolute: 0.1 10*3/uL (ref 0.0–0.5)
Eosinophils Relative: 1 %
HCT: 40.2 % (ref 36.0–46.0)
Hemoglobin: 12.5 g/dL (ref 12.0–15.0)
Immature Granulocytes: 0 %
Lymphocytes Relative: 20 %
Lymphs Abs: 1.5 10*3/uL (ref 0.7–4.0)
MCH: 26.8 pg (ref 26.0–34.0)
MCHC: 31.1 g/dL (ref 30.0–36.0)
MCV: 86.1 fL (ref 80.0–100.0)
Monocytes Absolute: 0.5 10*3/uL (ref 0.1–1.0)
Monocytes Relative: 7 %
Neutro Abs: 5.2 10*3/uL (ref 1.7–7.7)
Neutrophils Relative %: 71 %
Platelets: 281 10*3/uL (ref 150–400)
RBC: 4.67 MIL/uL (ref 3.87–5.11)
RDW: 13.1 % (ref 11.5–15.5)
WBC: 7.3 10*3/uL (ref 4.0–10.5)
nRBC: 0 % (ref 0.0–0.2)

## 2022-12-07 LAB — COMPREHENSIVE METABOLIC PANEL
ALT: 22 U/L (ref 0–44)
AST: 17 U/L (ref 15–41)
Albumin: 4.1 g/dL (ref 3.5–5.0)
Alkaline Phosphatase: 72 U/L (ref 38–126)
Anion gap: 10 (ref 5–15)
BUN: 10 mg/dL (ref 6–20)
CO2: 27 mmol/L (ref 22–32)
Calcium: 9.6 mg/dL (ref 8.9–10.3)
Chloride: 100 mmol/L (ref 98–111)
Creatinine, Ser: 1.62 mg/dL — ABNORMAL HIGH (ref 0.44–1.00)
GFR, Estimated: 39 mL/min — ABNORMAL LOW (ref >60)
Glucose, Bld: 97 mg/dL (ref 70–99)
Potassium: 4.1 mmol/L (ref 3.5–5.1)
Sodium: 137 mmol/L (ref 135–145)
Total Bilirubin: 0.4 mg/dL (ref 0.3–1.2)
Total Protein: 8.1 g/dL (ref 6.5–8.1)

## 2022-12-07 LAB — URINALYSIS, ROUTINE W REFLEX MICROSCOPIC
Bilirubin Urine: NEGATIVE
Glucose, UA: NEGATIVE mg/dL
Hgb urine dipstick: NEGATIVE
Ketones, ur: NEGATIVE mg/dL
Leukocytes,Ua: NEGATIVE
Nitrite: NEGATIVE
Protein, ur: NEGATIVE mg/dL
Specific Gravity, Urine: 1.009 (ref 1.005–1.030)
pH: 6 (ref 5.0–8.0)

## 2022-12-07 LAB — LACTIC ACID, PLASMA: Lactic Acid, Venous: 0.9 mmol/L (ref 0.5–1.9)

## 2022-12-07 LAB — I-STAT BETA HCG BLOOD, ED (MC, WL, AP ONLY): I-stat hCG, quantitative: 8.3 m[IU]/mL — ABNORMAL HIGH (ref <5)

## 2022-12-07 MED ORDER — OXYCODONE-ACETAMINOPHEN 5-325 MG PO TABS: 1.0000 | ORAL_TABLET | Freq: Four times a day (QID) | ORAL | 0 refills | Status: AC | PRN

## 2022-12-07 MED ORDER — DIPHENHYDRAMINE HCL 50 MG/ML IJ SOLN
12.5000 mg | Freq: Once | INTRAMUSCULAR | Status: AC
  Administered 2022-12-07: 12.5 mg via INTRAVENOUS
  Filled 2022-12-07: qty 1

## 2022-12-07 MED ORDER — ONDANSETRON HCL 4 MG/2ML IJ SOLN
4.0000 mg | Freq: Once | INTRAMUSCULAR | Status: AC
  Administered 2022-12-07: 4 mg via INTRAVENOUS
  Filled 2022-12-07: qty 2

## 2022-12-07 MED ORDER — LACTULOSE 10 GM/15ML PO SOLN: 10.0000 g | Freq: Every day | ORAL | 0 refills | Status: AC | PRN

## 2022-12-07 MED ORDER — LACTATED RINGERS IV SOLN: INTRAVENOUS | Status: DC

## 2022-12-07 MED ORDER — HYDROMORPHONE HCL 1 MG/ML IJ SOLN
0.5000 mg | Freq: Once | INTRAMUSCULAR | Status: AC
  Administered 2022-12-07: 0.5 mg via INTRAVENOUS
  Filled 2022-12-07: qty 1

## 2022-12-07 NOTE — H&P (Signed)
Office Visit Report     12/05/2022   --------------------------------------------------------------------------------   Marissa Simpson  MRN: [id number]  DOB: 1976-07-09, 47 year old Female  SSN:    PRIMARY CARE:  Glenda Chroman, MD  PRIMARY CARE FAX:  [phone]  REFERRING:    PROVIDER:  Daine Gravel, NP  LOCATION:  [facility] [phone]     --------------------------------------------------------------------------------   CC/HPI: cc: Left ureteral stone    HPI: Marissa Simpson is a 47 year old female who presents today due to concern for a left-sided 4 mm ureteral stone. This is proximal. She underwent CT imaging due to severe left-sided pain and discomfort. This was completed by her primary care physician. She continues to have abdominal pain and nausea. All of this began 3 days ago. She has never had a stone before. Pain is being managed with hydrocodone, acetaminophen, and tamsulosin.     ALLERGIES: Aspirin Ibuprofen    MEDICATIONS: Hydrochlorothiazide 12.5 mg tablet  Metformin Hcl 500 mg tablet tablet BID  Amlodipine Besylate 5 mg tablet  Atorvastatin Calcium 10 mg tablet  Flexeril  Ozempic 2 mg/0.75 ml (8 mg/3 ml) pen injector  Prozac 20 mg capsule  Tramadol Hcl 50 mg tablet tablet PRN     GU PSH: No GU PSH      PSH Notes: breast reduction, tubal ligation, carpel tunnel release   NON-GU PSH: No Non-GU PSH    GU PMH: None     PMH Notes: stomach ulcers   NON-GU PMH: Anxiety Arthritis Depression Diabetes Type 2 GERD Hypercholesterolemia Hypertension Stroke/TIA    FAMILY HISTORY: 1 Daughter - Daughter 2 sons - Son   SOCIAL HISTORY: Marital Status: Single Preferred Language: English; Ethnicity: Not Hispanic Or Latino; Race: Black or African American Current Smoking Status: Patient has never smoked.   Tobacco Use Assessment Completed: Used Tobacco in last 30 days? Has never drank.  Drinks 2 caffeinated drinks per day.     REVIEW OF SYSTEMS:    GU Review Female:   Patient reports get up at night to urinate and leakage of urine. Patient denies frequent urination, hard to postpone urination, burning /pain with urination, stream starts and stops, trouble starting your stream, have to strain to urinate, and being pregnant.  Gastrointestinal (Upper):   Patient denies nausea, vomiting, and indigestion/ heartburn.  Gastrointestinal (Lower):   Patient denies diarrhea and constipation.  Constitutional:   Patient denies fever, night sweats, weight loss, and fatigue.  Skin:   Patient denies skin rash/ lesion and itching.  Eyes:   Patient denies blurred vision and double vision.  Ears/ Nose/ Throat:   Patient denies sore throat and sinus problems.  Hematologic/Lymphatic:   Patient denies swollen glands and easy bruising.  Cardiovascular:   Patient denies leg swelling and chest pains.  Respiratory:   Patient denies cough and shortness of breath.  Endocrine:   Patient denies excessive thirst.  Musculoskeletal:   Patient denies back pain and joint pain.  Neurological:   Patient denies headaches and dizziness.  Psychologic:   Patient denies depression and anxiety.   VITAL SIGNS:      12/05/2022 03:32 PM  Weight 187 lb / 84.82 kg  Height 62.5 in / 158.75 cm  BP 126/81 mmHg  Pulse 82 /min  Temperature 97.5 F / 36.3 C  BMI 33.7 kg/m   MULTI-SYSTEM PHYSICAL EXAMINATION:    Constitutional: Well-nourished. No physical deformities. Normally developed. Good grooming.  Respiratory: No labored breathing, no use of accessory muscles.  Cardiovascular: Normal temperature, normal extremity pulses, no swelling, no varicosities.  Skin: No paleness, no jaundice, no cyanosis. No lesion, no ulcer, no rash.  Neurologic / Psychiatric: Oriented to time, oriented to place, oriented to person. No depression, no anxiety, no agitation.  Gastrointestinal: No mass, no tenderness, no rigidity, non obese abdomen.     Complexity of Data:   Source Of History:  Patient  Records Review:   Previous Hospital Records  Urine Test Review:   Urinalysis  X-Ray Review: [facility]: Reviewed Films. Reviewed Report. Discussed With Patient.  C.T. Abdomen/Pelvis: Reviewed Films. Reviewed Report.     12/05/22  Urinalysis  Urine Appearance Clear   Urine Color Yellow   Urine Glucose Neg mg/dL  Urine Bilirubin Neg mg/dL  Urine Ketones Neg mg/dL  Urine Specific Gravity 1.015   Urine Blood Neg ery/uL  Urine pH 6.5   Urine Protein Neg mg/dL  Urine Urobilinogen 0.2 mg/dL  Urine Nitrites Neg   Urine Leukocyte Esterase Neg leu/uL   PROCEDURES:         KUB [id number]  A single view of the abdomen is obtained. Bilateral renal shadows are visualized. Looking at the left ureter and tracing along the expected anatomical course in between L2 and L3 there is a 4 mm opacity that directly corresponds with the pain seen on CT imaging.      Patient confirmed No Neulasta OnPro Device.           Urinalysis Dipstick Dipstick Cont'd  Color: Yellow Bilirubin: Neg mg/dL  Appearance: Clear Ketones: Neg mg/dL  Specific Gravity: 1.015 Blood: Neg ery/uL  pH: 6.5 Protein: Neg mg/dL  Glucose: Neg mg/dL Urobilinogen: 0.2 mg/dL    Nitrites: Neg    Leukocyte Esterase: Neg leu/uL    ASSESSMENT:      ICD-10 Details  1 GU:   Ureteral calculus - N20.1 Left, Acute, Uncomplicated   PLAN:           Orders X-Rays: KUB          Schedule Return Visit/Planned Activity: Next Available Appointment - Schedule Surgery          Document Letter(s):  Created for Patient: Clinical Summary         Notes:   Left 4 mm ureteral stone is seen in between L2 and L3 very similar to CT imaging. Urinalysis will be sent for precautionary culture today. Stone intervention was discussed in detail today. For ureteroscopy, the patient understands that there is a chance for a staged procedure. Patient also understands that there is risk for bleeding, infection, injury to surrounding  organs, and general risks of anesthesia. The patient also understands the placement of a stent and the risks of stent placement including, risk for infection, the risk for pain, and the risk for injury. For ESWL, the patient understands that there is a chance of failure of procedure, there is also a risk for bruising, infection, bleeding, and injury to surrounding structures. The patient verbalized understanding to these risks.  She was given a strainer to strain her urine.  She would like to proceed with lithotripsy. Green sheet was placed today.     * Signed by Daine Gravel, NP on 12/06/22 at 2:48 PM (EDT)*

## 2022-12-07 NOTE — Telephone Encounter (Signed)
Received on (12/05/22) via of fax DME Standard Written Order Waynetta Sandy) (Amended) from [facility].  Requesting signature and return.  Given to provider to sign.  DME Standard Written Order Waynetta Sandy) (Amended) was signed and faxed back to [facility] [place].  Confirmation received and copy scanned into the chart.//AB/CMA

## 2022-12-07 NOTE — Discharge Instructions (Signed)
Make sure you are taking the pain medication regularly to avoid breakthrough pain.  Start by taking 10 mg of the lactulose to see if that helps with the bowel movement.  If you have we do not have a bowel movement with that double the dose and take it twice a day instead of once.  I did speak with your doctor and it is okay that we changed her pain medication.  Your prescription should be at your drugstore.  Over the weekend if you develop fever or signs of infection you should return to the hospital.

## 2022-12-07 NOTE — ED Notes (Signed)
Discharge instructions, medications and follow up with urology reviewed with patient.  Patient verbalizes understanding of instructions and she denies any further needs at this time.  W/c offered but declined by patient.  Patient is able to ambulate unassisted without difficulty.

## 2022-12-07 NOTE — ED Triage Notes (Signed)
Pt c/o left flank pain states is scheduled for lithotripsy procedure next week but pain is worse also c/o nausea.

## 2022-12-07 NOTE — Progress Notes (Signed)
Pre-op phone call complete. Procedure date and arrival time confirmed. Patient allergies, medications, and medical history verified. Patient to be NPO at midnight. Patient advised not to take Ozempic dose on Sunday and not to have NSAIDs within 48 hours of procedure. Last dose 12/02/2022 per patient. Patient to hold Metformin DOS and to stop vitamins. Driver secured.

## 2022-12-07 NOTE — ED Provider Notes (Signed)
[facility] Provider Note   CSN: [id number] Arrival date & time: 12/07/22  [id number]     History  Chief Complaint  Patient presents with   Flank Pain    Marissa Simpson is a 47 y.o. female.  Patient is a 47 year old female with a history of kidney stones, TIA, prediabetes, hypertension and prior stomach ulcers who was seen on 12/01/2020 with complaint of abdominal pain and found to have a high left ureteral stone causing significant obstruction who is presenting today with worsening pain nausea and vomiting.  Patient at the time of her emergency room visit 5 days ago was given IV pain and nausea medication with improvement of her symptoms and discharged home.  She has had Norco and Phenergan at home which she reports is not helping her pain.  She has spoken with urology and has a planned lithotripsy on Monday.  However she did not think she could go through the weekend as she has continued to have nausea, vomiting, cannot eat and has significant pain all the time.  She is taking the Norco every 5-6 hours reports she cannot remember the last time she had a bowel movement and has not been eating.  She does not think she has had a fever and denies any dysuria frequency or urgency.  Her pain is diffuse in her abdomen and she reports it just does not feel like her prior typical kidney stone pain and she was worried there may be something else going on.  She was supposed to have an appointment with [facility] at 11 AM this morning but was feeling so poorly she just did not feel like she could wait.  The history is provided by the patient.  Flank Pain       Home Medications Prior to Admission medications   Medication Sig Start Date End Date Taking? Authorizing Provider  lactulose (CHRONULAC) 10 GM/15ML solution Take 15 mLs (10 g total) by mouth daily as needed for moderate constipation. 12/07/22  Yes Blanchie Dessert, MD  oxyCODONE-acetaminophen  (PERCOCET/ROXICET) 5-325 MG tablet Take 1 tablet by mouth every 6 (six) hours as needed for severe pain. 12/07/22  Yes Holbert Caples, Loree Fee, MD  acetaminophen (TYLENOL) 650 MG CR tablet Take by mouth as needed.    [provider]  amLODipine (NORVASC) 2.5 MG tablet Take 2.5 mg by mouth daily. 10/12/20   [provider]  atorvastatin (LIPITOR) 20 MG tablet Take 1 tablet (20 mg total) by mouth every evening. 11/07/20   Johnson, Clanford L, MD  baclofen (LIORESAL) 10 MG tablet Take by mouth as needed. 08/17/21   [provider]  calcium carbonate (TUMS - DOSED IN MG ELEMENTAL CALCIUM) 500 MG chewable tablet Chew 1 tablet by mouth daily as needed for indigestion or heartburn.    [provider]  clonazePAM (KLONOPIN) 0.5 MG tablet Take 0.25 mg by mouth as needed for anxiety.    [provider]  cyclobenzaprine (FLEXERIL) 10 MG tablet Take 1 tablet (10 mg total) by mouth at bedtime as needed for muscle spasms. 03/16/16   Mesner, Corene Cornea, MD  FLUoxetine (PROZAC) 10 MG capsule Take 1 capsule by mouth daily. 06/13/21   [provider]  hydrochlorothiazide (MICROZIDE) 12.5 MG capsule Take 12.5 mg by mouth daily. 10/12/20   [provider]  metFORMIN (GLUCOPHAGE) 500 MG tablet Take by mouth. 12/09/20   [provider]  omeprazole (PRILOSEC) 40 MG capsule Take 1 capsule by mouth daily. 01/11/22  01/11/23  [provider]  ondansetron (ZOFRAN) 4 MG tablet Take 1 tablet (4 mg total) by mouth every 8 (eight) hours as needed for nausea or vomiting. 12/02/22   Blue, Soijett A, PA-C  promethazine (PHENERGAN) 25 MG tablet Take 25 mg by mouth every 8 (eight) hours as needed. 11/13/21   [provider]  Semaglutide, 2 MG/DOSE, (OZEMPIC, 2 MG/DOSE,) 8 MG/3ML SOPN Inject into the skin. 02/28/22   [provider]  traMADol (ULTRAM) 50 MG tablet Take 1 tablet (50 mg total) by mouth every 6 (six) hours as needed for moderate pain. 03/16/16    Mesner, Corene Cornea, MD  triamcinolone ointment (KENALOG) 0.1 % Apply topically as needed. 11/15/21   [provider]  Vitamin D, Ergocalciferol, (DRISDOL) 1.25 MG (50000 UNIT) CAPS capsule Take 1 capsule (50,000 Units total) by mouth every 7 (seven) days. 11/14/20   Murlean Iba, MD      Allergies    Aspirin, Darvocet [propoxyphene n-acetaminophen], Ibuprofen, and Dilaudid [hydromorphone hcl]    Review of Systems   Review of Systems  Genitourinary:  Positive for flank pain.    Physical Exam Updated Vital Signs BP 124/84 (BP Location: Right Arm)   Pulse 86   Temp 97.7 F (36.5 C) (Oral)   Resp 17   Ht 5\' 3"  (1.6 m)   Wt 79.8 kg   SpO2 100%   BMI 31.18 kg/m  Physical Exam Vitals and nursing note reviewed.  Constitutional:      General: She is not in acute distress.    Appearance: She is well-developed.  HENT:     Head: Normocephalic and atraumatic.  Eyes:     Pupils: Pupils are equal, round, and reactive to light.  Cardiovascular:     Rate and Rhythm: Regular rhythm. Tachycardia present.     Heart sounds: Normal heart sounds. No murmur heard.    No friction rub.  Pulmonary:     Effort: Pulmonary effort is normal.     Breath sounds: Normal breath sounds. No wheezing or rales.  Abdominal:     General: Bowel sounds are normal. There is no distension.     Palpations: Abdomen is soft.     Tenderness: There is abdominal tenderness. There is no guarding or rebound.     Comments: Patient has diffuse pain in her abdomen seems to be greater in the right lower quadrant but also has pain in the right upper quadrant and left lower quadrant.  No significant CVA tenderness.  Abdomen is soft  Musculoskeletal:        General: No tenderness. Normal range of motion.     Comments: No edema  Skin:    General: Skin is warm and dry.     Findings: No rash.  Neurological:     Mental Status: She is alert and oriented to person, place, and time. Mental status is at baseline.      Cranial Nerves: No cranial nerve deficit.  Psychiatric:        Mood and Affect: Mood normal.        Behavior: Behavior normal.     ED Results / Procedures / Treatments   Labs (all labs ordered are listed, but only abnormal results are displayed) Labs Reviewed  COMPREHENSIVE METABOLIC PANEL - Abnormal; Notable for the following components:      Result Value   Creatinine, Ser 1.62 (*)    GFR, Estimated 39 (*)    All other components within normal limits  I-STAT BETA  HCG BLOOD, ED ([facility], [facility], [facility] ONLY) - Abnormal; Notable for the following components:   I-stat hCG, quantitative 8.3 (*)    All other components within normal limits  CBC WITH DIFFERENTIAL/PLATELET  URINALYSIS, ROUTINE W REFLEX MICROSCOPIC  LACTIC ACID, PLASMA    EKG None  Radiology DG Abdomen 1 View  Result Date: 12/07/2022 CLINICAL DATA:  known left sided kidney stone, persistent pain, no bowel movements EXAM: ABDOMEN - 1 VIEW COMPARISON:  CT abdomen/pelvis December 02, 2022. FINDINGS: Rounded density projecting lateral to the left L4 transverse process could represent a ureteric calculus. Moderate colonic stool burden without evidence of obstruction. Polyarticular degenerative change. IMPRESSION: 1. Rounded density projecting lateral to the left L4 transverse process could represent a ureteric calculus. 2. Moderate colonic stool burden. Electronically Signed   By: Margaretha Sheffield M.D.   On: 12/07/2022 11:04    Procedures Procedures    Medications Ordered in ED Medications  lactated ringers infusion (0 mLs Intravenous Stopped 12/07/22 1244)  HYDROmorphone (DILAUDID) injection 0.5 mg (0.5 mg Intravenous Given 12/07/22 0926)  ondansetron (ZOFRAN) injection 4 mg (4 mg Intravenous Given 12/07/22 0924)  diphenhydrAMINE (BENADRYL) injection 12.5 mg (12.5 mg Intravenous Given 12/07/22 1015)    ED Course/ Medical Decision Making/ A&P                             Medical Decision Making Amount and/or Complexity of Data  Reviewed Labs: ordered. Decision-making details documented in ED Course. Radiology: ordered and independent interpretation performed. Decision-making details documented in ED Course.  Risk Prescription drug management.   Pt with multiple medical problems and comorbidities and presenting today with a complaint that caries a high risk for morbidity and mortality.  Here today with worsening abdominal pain nausea and vomiting.  Patient has a known obstructing kidney stone in the left that is getting lithotripsy on Monday and concerned that the pain from the stone is causing her symptoms however patient is also not been able to have a bowel movement for over a week since starting the pain medication despite trying to take Linzess.  She reports nausea and vomiting as well.  Lower suspicion for bowel obstruction on CT 5 days ago she was having similar type abdominal pain but there was no evidence of appendicitis or acute gallbladder pathology.  Suspect patient's worsening pain and symptoms is related to a persistent obstructing stone and she is having some referred pain but will check and ensure no new AKI or lab abnormalities.  Will also do a KUB to ensure no findings concerning for possible obstruction.  Patient given pain and nausea control.  5:15 PM I independently interpreted patient's labs and CBC without acute findings, UA within normal limits without evidence of infection.  CMP with mildly worsening AKI with creatinine 1.6 from 1.55 days ago but otherwise no acute findings.  hCG is minimally elevated but most likely because patient is perimenopausal.  She is improved after IV pain medication and fluids.  5:15 PM I have independently visualized and interpreted pt's images today.  KUB without evidence of obstruction but patient does have a significant amount of stool burden.  Radiology reports concern for kidney stone over the left L4 transverse process and a moderate colonic stool burden.  Spoke with  Dr. Abner Greenspan with urology but unfortunately today there is no available spots for lithotripsy.  Patient has no evidence of infection that would require a stent at this time.  Will  have to continue pain control and patient will have to have lithotripsy on Monday.  This was discussed with the patient.  At this time her pain is controlled.  Will try to switch her from hydrocodone to oxycodone to see if that helps with her symptoms.  Given return precautions if infectious symptoms develop.          Final Clinical Impression(s) / ED Diagnoses Final diagnoses:  Ureteral colic    Rx / DC Orders ED Discharge Orders          Ordered    lactulose (CHRONULAC) 10 GM/15ML solution  Daily PRN        12/07/22 1221    oxyCODONE-acetaminophen (PERCOCET/ROXICET) 5-325 MG tablet  Every 6 hours PRN        12/07/22 1221              Blanchie Dessert, MD 12/07/22 1715

## 2022-12-10 ENCOUNTER — Ambulatory Visit (HOSPITAL_BASED_OUTPATIENT_CLINIC_OR_DEPARTMENT_OTHER)
Admission: RE | Admit: 2022-12-10 | Discharge: 2022-12-10 | Disposition: A | Discharge status: Home / Self Care | Payer: BC Managed Care – PPO | Attending: Urology | Admitting: Urology

## 2022-12-10 ENCOUNTER — Other Ambulatory Visit: Payer: Self-pay

## 2022-12-10 ENCOUNTER — Encounter (HOSPITAL_BASED_OUTPATIENT_CLINIC_OR_DEPARTMENT_OTHER)
Admission: RE | Disposition: A | Discharge status: Home / Self Care | Payer: Self-pay | Source: Home / Self Care | Attending: Urology

## 2022-12-10 ENCOUNTER — Ambulatory Visit (HOSPITAL_COMMUNITY): Payer: BC Managed Care – PPO

## 2022-12-10 ENCOUNTER — Encounter (HOSPITAL_BASED_OUTPATIENT_CLINIC_OR_DEPARTMENT_OTHER): Payer: Self-pay | Admitting: Urology

## 2022-12-10 DIAGNOSIS — E119 Type 2 diabetes mellitus without complications: Secondary | ICD-10-CM | POA: Diagnosis not present

## 2022-12-10 DIAGNOSIS — I10 Essential (primary) hypertension: Secondary | ICD-10-CM | POA: Diagnosis not present

## 2022-12-10 DIAGNOSIS — Z01818 Encounter for other preprocedural examination: Secondary | ICD-10-CM

## 2022-12-10 DIAGNOSIS — N201 Calculus of ureter: Secondary | ICD-10-CM | POA: Diagnosis present

## 2022-12-10 HISTORY — PX: EXTRACORPOREAL SHOCK WAVE LITHOTRIPSY: SHX1557

## 2022-12-10 LAB — POCT PREGNANCY, URINE: Preg Test, Ur: NEGATIVE

## 2022-12-10 LAB — GLUCOSE, CAPILLARY: Glucose-Capillary: 95 mg/dL (ref 70–99)

## 2022-12-10 SURGERY — LITHOTRIPSY, ESWL
Anesthesia: LOCAL | Laterality: Left

## 2022-12-10 MED ORDER — DIAZEPAM 5 MG PO TABS
ORAL_TABLET | ORAL | Status: AC
  Filled 2022-12-10: qty 2

## 2022-12-10 MED ORDER — CIPROFLOXACIN HCL 500 MG PO TABS
500.0000 mg | ORAL_TABLET | ORAL | Status: AC
  Administered 2022-12-10: 500 mg via ORAL

## 2022-12-10 MED ORDER — CIPROFLOXACIN HCL 500 MG PO TABS
ORAL_TABLET | ORAL | Status: AC
  Filled 2022-12-10: qty 1

## 2022-12-10 MED ORDER — SODIUM CHLORIDE 0.9 % IV SOLN: INTRAVENOUS | Status: DC

## 2022-12-10 MED ORDER — DIPHENHYDRAMINE HCL 25 MG PO CAPS
25.0000 mg | ORAL_CAPSULE | ORAL | Status: AC
  Administered 2022-12-10: 25 mg via ORAL

## 2022-12-10 MED ORDER — DIPHENHYDRAMINE HCL 25 MG PO CAPS
ORAL_CAPSULE | ORAL | Status: AC
  Filled 2022-12-10: qty 1

## 2022-12-10 MED ORDER — DIAZEPAM 5 MG PO TABS
10.0000 mg | ORAL_TABLET | ORAL | Status: AC
  Administered 2022-12-10: 10 mg via ORAL

## 2022-12-10 NOTE — Discharge Instructions (Signed)
1. You should strain your urine and collect all fragments and bring them to your follow up appointment.  °2. You should take your pain medication as needed.  Please call if your pain is severe to the point that it is not controlled with your pain medication. °3. You should call if you develop fever > 101 or persistent nausea or vomiting. °4. Your doctor may prescribe tamsulosin to take to help facilitate stone passage. °

## 2022-12-10 NOTE — Interval H&P Note (Signed)
History and Physical Interval Note:  12/10/2022 11:38 AM  Marissa Simpson  has presented today for surgery, with the diagnosis of LEFT URETERAL CALCULI.  The various methods of treatment have been discussed with the patient and family. After consideration of risks, benefits and other options for treatment, the patient has consented to  Procedure(s) with comments: LEFT EXTRACORPOREAL SHOCK WAVE LITHOTRIPSY (ESWL) (Left) - 75 MINUTES as a surgical intervention.  The patient's history has been reviewed, patient examined, no change in status, stable for surgery.  I have reviewed the patient's chart and labs.  Questions were answered to the patient's satisfaction.     [employer]

## 2022-12-10 NOTE — Op Note (Signed)
See [facility] operative note scanned into chart. Also because of the size, density, location and other factors that cannot be anticipated I feel this will likely be a staged procedure. This fact supersedes any indication in the scanned [facility] operative note to the contrary.  

## 2022-12-11 ENCOUNTER — Encounter (HOSPITAL_BASED_OUTPATIENT_CLINIC_OR_DEPARTMENT_OTHER): Payer: Self-pay | Admitting: Urology

## 2023-02-09 ENCOUNTER — Other Ambulatory Visit: Payer: Self-pay

## 2023-02-09 ENCOUNTER — Emergency Department (HOSPITAL_COMMUNITY): Payer: BC Managed Care – PPO

## 2023-02-09 ENCOUNTER — Emergency Department (HOSPITAL_COMMUNITY)
Admission: EM | Admit: 2023-02-09 | Discharge: 2023-02-09 | Disposition: A | Discharge status: Home / Self Care | Payer: BC Managed Care – PPO | Attending: Emergency Medicine | Admitting: Emergency Medicine

## 2023-02-09 ENCOUNTER — Encounter (HOSPITAL_COMMUNITY): Payer: Self-pay

## 2023-02-09 DIAGNOSIS — R202 Paresthesia of skin: Secondary | ICD-10-CM | POA: Insufficient documentation

## 2023-02-09 DIAGNOSIS — Z79899 Other long term (current) drug therapy: Secondary | ICD-10-CM | POA: Insufficient documentation

## 2023-02-09 DIAGNOSIS — Z8673 Personal history of transient ischemic attack (TIA), and cerebral infarction without residual deficits: Secondary | ICD-10-CM | POA: Diagnosis not present

## 2023-02-09 DIAGNOSIS — I10 Essential (primary) hypertension: Secondary | ICD-10-CM | POA: Insufficient documentation

## 2023-02-09 DIAGNOSIS — R55 Syncope and collapse: Secondary | ICD-10-CM | POA: Insufficient documentation

## 2023-02-09 DIAGNOSIS — R42 Dizziness and giddiness: Secondary | ICD-10-CM | POA: Diagnosis present

## 2023-02-09 LAB — COMPREHENSIVE METABOLIC PANEL
ALT: 14 U/L (ref 0–44)
AST: 26 U/L (ref 15–41)
Albumin: 3.7 g/dL (ref 3.5–5.0)
Alkaline Phosphatase: 54 U/L (ref 38–126)
Anion gap: 10 (ref 5–15)
BUN: 15 mg/dL (ref 6–20)
CO2: 24 mmol/L (ref 22–32)
Calcium: 9.2 mg/dL (ref 8.9–10.3)
Chloride: 104 mmol/L (ref 98–111)
Creatinine, Ser: 0.95 mg/dL (ref 0.44–1.00)
GFR, Estimated: >60 mL/min (ref >60)
Glucose, Bld: 109 mg/dL — ABNORMAL HIGH (ref 70–99)
Potassium: 3.8 mmol/L (ref 3.5–5.1)
Sodium: 138 mmol/L (ref 135–145)
Total Bilirubin: 0.6 mg/dL (ref 0.3–1.2)
Total Protein: 7.5 g/dL (ref 6.5–8.1)

## 2023-02-09 LAB — CBC WITH DIFFERENTIAL/PLATELET
Abs Immature Granulocytes: 0.01 10*3/uL (ref 0.00–0.07)
Basophils Absolute: 0.1 10*3/uL (ref 0.0–0.1)
Basophils Relative: 1 %
Eosinophils Absolute: 0.1 10*3/uL (ref 0.0–0.5)
Eosinophils Relative: 2 %
HCT: 36.3 % (ref 36.0–46.0)
Hemoglobin: 11.2 g/dL — ABNORMAL LOW (ref 12.0–15.0)
Immature Granulocytes: 0 %
Lymphocytes Relative: 36 %
Lymphs Abs: 2.3 10*3/uL (ref 0.7–4.0)
MCH: 27.1 pg (ref 26.0–34.0)
MCHC: 30.9 g/dL (ref 30.0–36.0)
MCV: 87.9 fL (ref 80.0–100.0)
Monocytes Absolute: 0.6 10*3/uL (ref 0.1–1.0)
Monocytes Relative: 9 %
Neutro Abs: 3.4 10*3/uL (ref 1.7–7.7)
Neutrophils Relative %: 52 %
Platelets: 269 10*3/uL (ref 150–400)
RBC: 4.13 MIL/uL (ref 3.87–5.11)
RDW: 14.6 % (ref 11.5–15.5)
WBC: 6.4 10*3/uL (ref 4.0–10.5)
nRBC: 0 % (ref 0.0–0.2)

## 2023-02-09 LAB — TROPONIN I (HIGH SENSITIVITY)
Troponin I (High Sensitivity): <2 ng/L (ref <18)
Troponin I (High Sensitivity): 2 ng/L (ref <18)

## 2023-02-09 LAB — CBG MONITORING, ED: Glucose-Capillary: 102 mg/dL — ABNORMAL HIGH (ref 70–99)

## 2023-02-09 MED ORDER — SODIUM CHLORIDE 0.9 % IV BOLUS
1000.0000 mL | Freq: Once | INTRAVENOUS | Status: AC
  Administered 2023-02-09: 1000 mL via INTRAVENOUS

## 2023-02-09 NOTE — ED Triage Notes (Signed)
Pt BIB [facility] after waking up feeling lightheaded, dizziness, weakness. Hx of TIA 2 years ago, states this episode feels similar.   Denies N/V/D, no fevers.   Has not had any medications today.

## 2023-02-09 NOTE — ED Notes (Signed)
Patient transported to CT 

## 2023-02-09 NOTE — Discharge Instructions (Signed)
You were seen in the emergency department for dizziness numbness feeling faint tingling in your arms and legs.  You had blood work EKG and a CAT scan of your head that did not show a definite cause of your symptoms.  Please continue your regular medications and follow-up with your primary care doctor.  Stay well-hydrated and get plenty rest.  Return to the emergency department if any worsening or concerning symptoms.

## 2023-02-09 NOTE — ED Provider Notes (Signed)
[place] EMERGENCY DEPARTMENT AT [facility] Provider Note   CSN: [id number] Arrival date & time: 02/09/23  4098     History  Chief Complaint  Patient presents with   Dizziness    Marissa Simpson is a 47 y.o. female.  She has a history of hypertension prediabetes TIA.  She said she was fine when she woke up this morning but when she tried to get out of bed she found her legs stuff and she was numb all over.  She was having trouble walking and needed to grab onto things.  Felt like he was going to pass out.  Blurry vision.  Ultimately she called 911.  She said she has not been sick recently no chest pain shortness of breath abdominal pain nausea vomiting diarrhea or urinary symptoms.  No significant headache.  Has been taking her medications regularly.  No recent falls.  She was admitted for headache dizziness left-sided numbness back and 2022 had a negative MRI MRA and carotid ultra sound.  The history is provided by the patient.  Dizziness Quality:  Lightheadedness and imbalance Severity:  Severe Onset quality:  Sudden Duration:  2 hours Timing:  Constant Progression:  Unchanged Chronicity:  Recurrent Context: standing up   Relieved by:  None tried Worsened by:  Standing up Ineffective treatments:  None tried Associated symptoms: vision changes and weakness (general)   Associated symptoms: no chest pain, no diarrhea, no headaches, no hearing loss, no nausea, no palpitations, no shortness of breath and no vomiting        Home Medications Prior to Admission medications   Medication Sig Start Date End Date Taking? Authorizing Provider  acetaminophen (TYLENOL) 650 MG CR tablet Take by mouth as needed.    [provider]  amLODipine (NORVASC) 2.5 MG tablet Take 2.5 mg by mouth daily. 10/12/20   [provider]  atorvastatin (LIPITOR) 20 MG tablet Take 1 tablet (20 mg total) by mouth every evening. 11/07/20   Johnson, Clanford L, MD  baclofen  (LIORESAL) 10 MG tablet Take by mouth as needed. 08/17/21   [provider]  calcium carbonate (TUMS - DOSED IN MG ELEMENTAL CALCIUM) 500 MG chewable tablet Chew 1 tablet by mouth daily as needed for indigestion or heartburn.    [provider]  clonazePAM (KLONOPIN) 0.5 MG tablet Take 0.25 mg by mouth as needed for anxiety.    [provider]  cyclobenzaprine (FLEXERIL) 10 MG tablet Take 1 tablet (10 mg total) by mouth at bedtime as needed for muscle spasms. 03/16/16   Mesner, Barbara Cower, MD  FLUoxetine (PROZAC) 10 MG capsule Take 1 capsule by mouth daily. 06/13/21   [provider]  hydrochlorothiazide (MICROZIDE) 12.5 MG capsule Take 12.5 mg by mouth daily. 10/12/20   [provider]  lactulose (CHRONULAC) 10 GM/15ML solution Take 15 mLs (10 g total) by mouth daily as needed for moderate constipation. 12/07/22   Gwyneth Sprout, MD  metFORMIN (GLUCOPHAGE) 500 MG tablet Take by mouth. 12/09/20   [provider]  ondansetron (ZOFRAN) 4 MG tablet Take 1 tablet (4 mg total) by mouth every 8 (eight) hours as needed for nausea or vomiting. 12/02/22   Blue, Soijett A, PA-C  oxyCODONE-acetaminophen (PERCOCET/ROXICET) 5-325 MG tablet Take 1 tablet by mouth every 6 (six) hours as needed for severe pain. 12/07/22   Gwyneth Sprout, MD  promethazine (PHENERGAN) 25 MG tablet Take 25 mg by mouth every 8 (eight) hours as needed. 11/13/21   [provider]  Semaglutide, 2 MG/DOSE, (OZEMPIC, 2 MG/DOSE,) 8 MG/3ML SOPN Inject into the skin. 02/28/22   [provider]  triamcinolone ointment (KENALOG) 0.1 % Apply topically as needed. 11/15/21   [provider]  Vitamin D, Ergocalciferol, (DRISDOL) 1.25 MG (50000 UNIT) CAPS capsule Take 1 capsule (50,000 Units total) by mouth every 7 (seven) days. 11/14/20   Cleora Fleet, MD      Allergies    Aspirin, Darvocet [propoxyphene n-acetaminophen], Ibuprofen, and Dilaudid [hydromorphone hcl]     Review of Systems   Review of Systems  Constitutional:  Negative for fever.  HENT:  Negative for hearing loss.   Eyes:  Positive for visual disturbance.  Respiratory:  Negative for shortness of breath.   Cardiovascular:  Negative for chest pain and palpitations.  Gastrointestinal:  Negative for diarrhea, nausea and vomiting.  Genitourinary:  Negative for dysuria.  Neurological:  Positive for dizziness and weakness (general). Negative for headaches.    Physical Exam Updated Vital Signs BP (!) 145/95 (BP Location: Right Arm)   Pulse 91   Temp 97.9 F (36.6 C) (Oral)   Resp 16   Ht 5\' 2"  (1.575 m)   Wt 84.4 kg   SpO2 97%   BMI 34.02 kg/m  Physical Exam Vitals and nursing note reviewed.  Constitutional:      General: She is not in acute distress.    Appearance: Normal appearance. She is well-developed.  HENT:     Head: Normocephalic and atraumatic.  Eyes:     Conjunctiva/sclera: Conjunctivae normal.  Cardiovascular:     Rate and Rhythm: Normal rate and regular rhythm.     Heart sounds: No murmur heard. Pulmonary:     Effort: Pulmonary effort is normal. No respiratory distress.     Breath sounds: Normal breath sounds.  Abdominal:     Palpations: Abdomen is soft.     Tenderness: There is no abdominal tenderness. There is no guarding or rebound.  Musculoskeletal:        General: No deformity. Normal range of motion.     Cervical back: Neck supple.     Right lower leg: No edema.     Left lower leg: No edema.  Skin:    General: Skin is warm and dry.     Capillary Refill: Capillary refill takes less than 2 seconds.  Neurological:     General: No focal deficit present.     Mental Status: She is alert and oriented to person, place, and time.     Cranial Nerves: No cranial nerve deficit.     Sensory: No sensory deficit.     Motor: No weakness.     Comments: She does endorse subjective tingling in her whole body especially in her head.     ED Results / Procedures /  Treatments   Labs (all labs ordered are listed, but only abnormal results are displayed) Labs Reviewed  COMPREHENSIVE METABOLIC PANEL - Abnormal; Notable for the following components:      Result Value   Glucose, Bld 109 (*)    All other components within normal limits  CBC WITH DIFFERENTIAL/PLATELET - Abnormal; Notable for the following components:   Hemoglobin 11.2 (*)    All other components within normal limits  CBG MONITORING, ED - Abnormal; Notable for the following components:   Glucose-Capillary 102 (*)    All other components within normal limits  URINALYSIS, ROUTINE W REFLEX MICROSCOPIC  RAPID URINE DRUG SCREEN, HOSP PERFORMED  TROPONIN I (HIGH SENSITIVITY)  TROPONIN I (HIGH SENSITIVITY)    EKG EKG Interpretation  Date/Time:  Saturday Feb 09 2023 09:20:13 EDT Ventricular Rate:  95 PR Interval:  154 QRS Duration: 106 QT Interval:  390 QTC Calculation: 491 R Axis:   10 Text Interpretation: Sinus rhythm Borderline prolonged QT interval No significant change since prior 8/23 Confirmed by Meridee Score [phone]) on 02/09/2023 9:33:36 AM  Radiology CT Head Wo Contrast  Result Date: 02/09/2023 CLINICAL DATA:  Neuro deficit, acute, stroke suspected. Tingling/numbness all over. Lightheadedness/dizziness. EXAM: CT HEAD WITHOUT CONTRAST TECHNIQUE: Contiguous axial images were obtained from the base of the skull through the vertex without intravenous contrast. RADIATION DOSE REDUCTION: This exam was performed according to the departmental dose-optimization program which includes automated exposure control, adjustment of the mA and/or kV according to patient size and/or use of iterative reconstruction technique. COMPARISON:  Head CT 11/06/2020 and MRI 11/07/2020 FINDINGS: Brain: There is no evidence of an acute infarct, intracranial hemorrhage, mass, midline shift, or extra-axial fluid collection. The ventricles and sulci are normal. A partially empty sella is again noted. Vascular: No  hyperdense vessel. Skull: No acute fracture or suspicious osseous lesion. Sinuses/Orbits: Visualized paranasal sinuses and mastoid air cells are clear. Visualized portions of the orbits are unremarkable. Other: None. IMPRESSION: No evidence of acute intracranial abnormality. Electronically Signed   By: Sebastian Ache M.D.   On: 02/09/2023 09:56    Procedures Procedures    Medications Ordered in ED Medications  sodium chloride 0.9 % bolus 1,000 mL (has no administration in time range)    ED Course/ Medical Decision Making/ A&P Clinical Course as of 02/09/23 1712  Sat Feb 09, 2023  1356 Reassessed patient and she said she is feeling much better.  Still little lightheaded.  Reviewed results of workup with her and she is comfortable plan for discharge and outpatient follow-up.  Return instructions discussed. [MB]    Clinical Course User Index [MB] Terrilee Files, MD                             Medical Decision Making Amount and/or Complexity of Data Reviewed Labs: ordered. Radiology: ordered.   This patient complains of dizzy lightheaded tingling all over; this involves an extensive number of treatment Options and is a complaint that carries with it a high risk of complications and morbidity. The differential includes anxiety, hypoglycemia, hypotension, metabolic derangement, stroke, bleed, ACS  I ordered, reviewed and interpreted labs, which included CBC with normal white count hemoglobin slightly down from priors, chemistries LFTs normal, troponins flat, urinalysis ordered not obtained I ordered medication IV fluids and reviewed PMP when indicated. I ordered imaging studies which included head CT and I independently    visualized and interpreted imaging which showed no acute findings Additional history obtained from EMS Previous records obtained and reviewed in epic no recent admissions  Cardiac monitoring reviewed, normal sinus rhythm Social determinants considered, no  significant barriers Critical Interventions: None  After the interventions stated above, I reevaluated the patient and found patient's symptoms improved over time although not completely resolved Admission and further testing considered, no indications for admission or further workup at this time.  Recommended close follow-up with PCP.  Return instructions discussed.         Final Clinical Impression(s) / ED Diagnoses Final diagnoses:  Paresthesia  Near syncope  Lightheadedness    Rx / DC Orders ED Discharge Orders     None  Terrilee Files, MD 02/09/23 [phone]

## 2023-07-11 ENCOUNTER — Telehealth: Payer: Self-pay

## 2023-07-11 NOTE — Telephone Encounter (Signed)
Faxed the signed DME Standard Written Order form to [employer] with confirmed receipt. Will scan into chart.

## 2024-01-29 ENCOUNTER — Institutional Professional Consult (permissible substitution): Admitting: Plastic Surgery

## 2024-04-11 ENCOUNTER — Encounter (HOSPITAL_BASED_OUTPATIENT_CLINIC_OR_DEPARTMENT_OTHER): Payer: Self-pay | Admitting: Emergency Medicine

## 2024-04-11 ENCOUNTER — Emergency Department (HOSPITAL_BASED_OUTPATIENT_CLINIC_OR_DEPARTMENT_OTHER)

## 2024-04-11 ENCOUNTER — Other Ambulatory Visit: Payer: Self-pay

## 2024-04-11 ENCOUNTER — Emergency Department (HOSPITAL_BASED_OUTPATIENT_CLINIC_OR_DEPARTMENT_OTHER)
Admission: EM | Admit: 2024-04-11 | Discharge: 2024-04-11 | Disposition: A | Discharge status: Home / Self Care | Attending: Emergency Medicine | Admitting: Emergency Medicine

## 2024-04-11 ENCOUNTER — Emergency Department (HOSPITAL_BASED_OUTPATIENT_CLINIC_OR_DEPARTMENT_OTHER): Admitting: Radiology

## 2024-04-11 DIAGNOSIS — R079 Chest pain, unspecified: Secondary | ICD-10-CM | POA: Diagnosis present

## 2024-04-11 DIAGNOSIS — I10 Essential (primary) hypertension: Secondary | ICD-10-CM | POA: Insufficient documentation

## 2024-04-11 DIAGNOSIS — Z8673 Personal history of transient ischemic attack (TIA), and cerebral infarction without residual deficits: Secondary | ICD-10-CM | POA: Diagnosis not present

## 2024-04-11 DIAGNOSIS — Z79899 Other long term (current) drug therapy: Secondary | ICD-10-CM | POA: Diagnosis not present

## 2024-04-11 DIAGNOSIS — M546 Pain in thoracic spine: Secondary | ICD-10-CM | POA: Insufficient documentation

## 2024-04-11 LAB — CBC
HCT: 38.3 % (ref 36.0–46.0)
Hemoglobin: 12 g/dL (ref 12.0–15.0)
MCH: 27.3 pg (ref 26.0–34.0)
MCHC: 31.3 g/dL (ref 30.0–36.0)
MCV: 87.2 fL (ref 80.0–100.0)
Platelets: 187 K/uL (ref 150–400)
RBC: 4.39 MIL/uL (ref 3.87–5.11)
RDW: 13.4 % (ref 11.5–15.5)
WBC: 4.1 K/uL (ref 4.0–10.5)
nRBC: 0 % (ref 0.0–0.2)

## 2024-04-11 LAB — BASIC METABOLIC PANEL WITH GFR
Anion gap: 11 (ref 5–15)
BUN: 18 mg/dL (ref 6–20)
CO2: 27 mmol/L (ref 22–32)
Calcium: 10.3 mg/dL (ref 8.9–10.3)
Chloride: 102 mmol/L (ref 98–111)
Creatinine, Ser: 1.13 mg/dL — ABNORMAL HIGH (ref 0.44–1.00)
GFR, Estimated: 59 mL/min — ABNORMAL LOW (ref >60)
Glucose, Bld: 76 mg/dL (ref 70–99)
Potassium: 4.6 mmol/L (ref 3.5–5.1)
Sodium: 139 mmol/L (ref 135–145)

## 2024-04-11 LAB — TROPONIN T, HIGH SENSITIVITY
Troponin T High Sensitivity: <15 ng/L (ref <19)
Troponin T High Sensitivity: <15 ng/L (ref <19)

## 2024-04-11 LAB — PREGNANCY, URINE: Preg Test, Ur: NEGATIVE

## 2024-04-11 MED ORDER — MORPHINE SULFATE (PF) 4 MG/ML IV SOLN
4.0000 mg | Freq: Once | INTRAVENOUS | Status: AC
  Administered 2024-04-11: 4 mg via INTRAVENOUS
  Filled 2024-04-11: qty 1

## 2024-04-11 MED ORDER — IOHEXOL 350 MG/ML SOLN
75.0000 mL | Freq: Once | INTRAVENOUS | Status: AC | PRN
  Administered 2024-04-11: 65 mL via INTRAVENOUS

## 2024-04-11 MED ORDER — ONDANSETRON HCL 4 MG/2ML IJ SOLN
4.0000 mg | Freq: Once | INTRAMUSCULAR | Status: AC
  Administered 2024-04-11: 4 mg via INTRAVENOUS
  Filled 2024-04-11: qty 2

## 2024-04-11 NOTE — ED Provider Notes (Signed)
  EMERGENCY DEPARTMENT AT [facility] Provider Note   CSN: [id number] Arrival date & time: 04/11/24  1714     Patient presents with: Chest Pain   Marissa Simpson is a 48 y.o. female with a past medical history of TIA, HTN, kidney stones, stomach ulcer, OSA presents to emergency department for evaluation of chest pain that started this morning while laying in bed following waking.  Pain waxes and wanes from 4/10 to 10/10.  Reports that nothing makes it better or worse.  Pain radiates through and around into left back.  Has been taking Tums, baclofen, tramadol  without relief to pain.  Denies SHOB, recent travel, surgery, pedal edema, history of clots, OCP, known trauma, cough, congestion, fevers     Chest Pain      Prior to Admission medications   Medication Sig Start Date End Date Taking? Authorizing Provider  acetaminophen  (TYLENOL ) 650 MG CR tablet Take by mouth as needed.    [provider]  amLODipine (NORVASC) 2.5 MG tablet Take 2.5 mg by mouth daily. 10/12/20   [provider]  atorvastatin  (LIPITOR) 20 MG tablet Take 1 tablet (20 mg total) by mouth every evening. 11/07/20   Johnson, Clanford L, MD  baclofen (LIORESAL) 10 MG tablet Take by mouth as needed. 08/17/21   [provider]  calcium  carbonate (TUMS - DOSED IN MG ELEMENTAL CALCIUM ) 500 MG chewable tablet Chew 1 tablet by mouth daily as needed for indigestion or heartburn.    [provider]  clonazePAM (KLONOPIN) 0.5 MG tablet Take 0.25 mg by mouth as needed for anxiety.    [provider]  cyclobenzaprine  (FLEXERIL ) 10 MG tablet Take 1 tablet (10 mg total) by mouth at bedtime as needed for muscle spasms. 03/16/16   Mesner, Jason, MD  FLUoxetine (PROZAC) 10 MG capsule Take 1 capsule by mouth daily. 06/13/21   [provider]  hydrochlorothiazide (MICROZIDE) 12.5 MG capsule Take 12.5 mg by mouth daily. 10/12/20   [provider]  lactulose   (CHRONULAC ) 10 GM/15ML solution Take 15 mLs (10 g total) by mouth daily as needed for moderate constipation. 12/07/22   Doretha Folks, MD  metFORMIN (GLUCOPHAGE) 500 MG tablet Take by mouth. 12/09/20   [provider]  ondansetron  (ZOFRAN ) 4 MG tablet Take 1 tablet (4 mg total) by mouth every 8 (eight) hours as needed for nausea or vomiting. 12/02/22   Blue, Soijett A, PA-C  oxyCODONE -acetaminophen  (PERCOCET/ROXICET) 5-325 MG tablet Take 1 tablet by mouth every 6 (six) hours as needed for severe pain. 12/07/22   Doretha Folks, MD  promethazine (PHENERGAN) 25 MG tablet Take 25 mg by mouth every 8 (eight) hours as needed. 11/13/21   [provider]  Semaglutide, 2 MG/DOSE, (OZEMPIC, 2 MG/DOSE,) 8 MG/3ML SOPN Inject into the skin. 02/28/22   [provider]  triamcinolone ointment (KENALOG) 0.1 % Apply topically as needed. 11/15/21   [provider]  Vitamin D , Ergocalciferol , (DRISDOL ) 1.25 MG (50000 UNIT) CAPS capsule Take 1 capsule (50,000 Units total) by mouth every 7 (seven) days. 11/14/20   Vicci Afton CROME, MD    Allergies: Aspirin , Darvocet [propoxyphene n-acetaminophen ], Ibuprofen, and Dilaudid  [hydromorphone  hcl]    Review of Systems  Cardiovascular:  Positive for chest pain.    Updated Vital Signs BP 111/67   Pulse 88   Temp 98.6 F (37 C)   Resp 18   Ht 5' 2 (1.575 m)   Wt 68.9 kg   SpO2 100%   BMI  27.80 kg/m   Physical Exam Vitals and nursing note reviewed.  Constitutional:      General: She is not in acute distress.    Appearance: Normal appearance.  HENT:     Head: Normocephalic and atraumatic.  Eyes:     Conjunctiva/sclera: Conjunctivae normal.  Cardiovascular:     Rate and Rhythm: Normal rate.  Pulmonary:     Effort: Pulmonary effort is normal. No respiratory distress.     Breath sounds: Normal breath sounds.  Musculoskeletal:       Arms:     Right lower leg: No edema.     Left lower leg: No edema.     Comments: No  swelling nor palpable cords, no tenderness to palpation of BLE.  [place]' sign negative x 2  Skin:    Capillary Refill: Capillary refill takes less than 2 seconds.     Coloration: Skin is not jaundiced or pale.  Neurological:     Mental Status: She is alert and oriented to person, place, and time. Mental status is at baseline.     Comments: Sensation 2/2 BUE and BLE     (all labs ordered are listed, but only abnormal results are displayed) Labs Reviewed  BASIC METABOLIC PANEL WITH GFR - Abnormal; Notable for the following components:      Result Value   Creatinine, Ser 1.13 (*)    GFR, Estimated 59 (*)    All other components within normal limits  CBC  PREGNANCY, URINE  TROPONIN T, HIGH SENSITIVITY  TROPONIN T, HIGH SENSITIVITY    EKG: EKG Interpretation Date/Time:  Saturday April 11 2024 17:24:41 EDT Ventricular Rate:  97 PR Interval:  138 QRS Duration:  84 QT Interval:  348 QTC Calculation: 441 R Axis:   76  Text Interpretation: Normal sinus rhythm Right atrial enlargement Borderline ECG When compared with ECG of 09-Feb-2023 09:20, PREVIOUS ECG IS PRESENT since last tracing no significant change Confirmed by Lenor Hollering [phone]) on 04/11/2024 5:41:06 PM  Radiology: CT Angio Chest Aorta W and/or Wo Contrast Result Date: 04/11/2024 CLINICAL DATA:  Left-sided chest pain radiating to the shoulder blade, initial encounter EXAM: CT ANGIOGRAPHY CHEST WITH CONTRAST TECHNIQUE: Multidetector CT imaging of the chest was performed using the standard protocol during bolus administration of intravenous contrast. Multiplanar CT image reconstructions and MIPs were obtained to evaluate the vascular anatomy. RADIATION DOSE REDUCTION: This exam was performed according to the departmental dose-optimization program which includes automated exposure control, adjustment of the mA and/or kV according to patient size and/or use of iterative reconstruction technique. CONTRAST:  65mL OMNIPAQUE  IOHEXOL  350  MG/ML SOLN COMPARISON:  Chest x-ray from earlier in the same day. FINDINGS: Cardiovascular: Thoracic aorta is well visualized within normal enhancement pattern. No aneurysmal dilatation or dissection is identified. The brachiocephalic vessels appear within normal limits. The pulmonary artery is well visualized within normal branching pattern. No pulmonary emboli are seen. No cardiac enlargement is seen. No coronary calcifications noted. Mediastinum/Nodes: Thoracic inlet is within normal limits. No hilar or mediastinal adenopathy is noted. The esophagus as visualized is within normal limits. Lungs/Pleura: Lungs are well aerated bilaterally. No focal infiltrate or sizable effusion is seen. Tiny 1-2 mm nodule is noted in the left lower lobe best seen on image number 72 of series 6. Small subpleural nodule is noted in the right middle lobe best seen on image number 78 of series 6. It measures approximately 3 mm. Upper Abdomen: Visualized upper abdomen shows no acute abnormality. Musculoskeletal: No acute bony  abnormality is noted. Review of the MIP images confirms the above findings. IMPRESSION: No evidence of thoracic dissection or aneurysmal dilatation. No evidence of pulmonary emboli. Multiple pulmonary nodules. Most significant: Less than 6 mm right solid pulmonary nodule. Per Fleischner Society Guidelines, no routine follow-up imaging is recommended. These guidelines do not apply to immunocompromised patients and patients with cancer. Follow up in patients with significant comorbidities as clinically warranted. For lung cancer screening, adhere to Lung-RADS guidelines. Reference: Radiology. 2017; 284(1):228-43. Electronically Signed   By: Oneil Devonshire M.D.   On: 04/11/2024 20:38   DG Chest 2 View Result Date: 04/11/2024 CLINICAL DATA:  Chest pain EXAM: CHEST - 2 VIEW COMPARISON:  Chest x-ray 08/13/2012 FINDINGS: The heart size and mediastinal contours are within normal limits. Both lungs are clear. The  visualized skeletal structures are unremarkable. IMPRESSION: No active cardiopulmonary disease. Electronically Signed   By: Greig Pique M.D.   On: 04/11/2024 18:37      Medications Ordered in the ED  morphine  (PF) 4 MG/ML injection 4 mg (4 mg Intravenous Given 04/11/24 1930)  ondansetron  (ZOFRAN ) injection 4 mg (4 mg Intravenous Given 04/11/24 1930)  iohexol  (OMNIPAQUE ) 350 MG/ML injection 75 mL (65 mLs Intravenous Contrast Given 04/11/24 2022)                                    Medical Decision Making Amount and/or Complexity of Data Reviewed Labs: ordered. Radiology: ordered.  Risk Prescription drug management.   Patient presents to the ED for concern of CP, back pain, this involves an extensive number of treatment options, and is a complaint that carries with it a high risk of complications and morbidity.  The differential diagnosis includes ACS, pneumonia, fluid overload, MSK, dissection, PE, muscle strain, fracture   Co morbidities that complicate the patient evaluation  See HPI   Additional history obtained:  Additional history obtained from Nursing   External records from outside source obtained and reviewed including triage RN note   Lab Tests:  I Ordered, and personally interpreted labs.  The pertinent results include:   Delta troponin flat Currently 1.13   Imaging Studies ordered:  I ordered imaging studies including chest x-ray, CT dissection study I independently visualized and interpreted imaging which showed no thoracic aortic dissection I agree with the radiologist interpretation   Cardiac Monitoring:  The patient was maintained on a cardiac monitor.  I personally viewed and interpreted the cardiac monitored which showed an underlying rhythm of: NSR with no ST nor T wave abnormalities   Medicines ordered and prescription drug management:  I ordered medication including morphine  for pain Reevaluation of the patient after these medicines showed  that the patient improved I have reviewed the patients home medicines and have made adjustments as needed     Problem List / ED Course:  CP Back pain Pinpoint CP to left anterior chest wall and thoracic back as noted above.  No worsening pain with bending/twisting nor movement of arm. No known traumatic injury Was unable to provide ASA as she refused it d/t hx of ulcers in past Provided morphine  for pain Reports pain waxes and wanes. Radiates through into back and around into back so obtained CT dissection study to ensure no PE nor dissection which was fortunately negative EKG shows NSR with no T wave or ST abnormalities.  Troponin x 2 flat and WNL.  Chest x-ray without signs of fluid  overload, pneumonia Reports significant improvement of pain following morphine . With reassuring workup, will treat patient for possible MSK pathology as etiology of pain.  As she is unable to take ASA nor NSAIDs, recommended topical lidocaine  patches, Voltaren gel, Tylenol , heat, ice for pain management. With heart score of 1, will have patient follow-up with PCP for further management   Reevaluation:  After the interventions noted above, I reevaluated the patient and found that they have :improved   Social Determinants of Health:  Has pcp   Dispostion:  After consideration of the diagnostic results and the patients response to treatment, I feel that the patent would benefit from outpatient management PCP follow-up.   Discussed ED workup, disposition, return to ED precautions with patient who expresses understanding agrees with plan.  All questions answered to their satisfaction.  They are agreeable to plan.  Discharge instructions provided on paperwork  Final diagnoses:  Chest pain, unspecified type  Acute left-sided thoracic back pain    ED Discharge Orders     None        Minnie Tinnie BRAVO, PA 04/11/24 2325    Lenor Hollering, MD 04/11/24 [phone]

## 2024-04-11 NOTE — Discharge Instructions (Signed)
 Thank for letting us  evaluate you today.  Your troponin/heart enzymes were negative for cardiac injury.  Your CT imaging did not show any signs of blood clot nor aortic dissection.  Your other lab work was unremarkable.  Please try using Voltaren gel, lidocaine  patches for topical pain relief, Flexeril  for muscle spasms.  Please follow-up with primary care provider for further management.  Please return to emergency room if you experience worsening symptoms, chest pain, shortness of breath

## 2024-04-11 NOTE — ED Triage Notes (Signed)
 Pt c/o L chest pain, radiating to back under shoulder blade that started this morning when laying in bed. Denies any SOB. HTN cardiac history.

## 2024-04-11 NOTE — ED Notes (Signed)
 Reviewed discharge instructions and follow up care with pt. Pt verbalized understanding and had no further questions. Pt exited ED without complications.
